# Patient Record
Sex: Female | Born: 2000
Health system: Southern US, Community
[De-identification: ages and names within clinical notes are randomized; demographics above are authoritative.]

## PROBLEM LIST (undated history)

## (undated) ENCOUNTER — Inpatient Hospital Stay (HOSPITAL_COMMUNITY): Admission: RE | Payer: BC Managed Care – PPO | Source: Ambulatory Visit

## (undated) DIAGNOSIS — R51 Headache: Secondary | ICD-10-CM

## (undated) DIAGNOSIS — D509 Iron deficiency anemia, unspecified: Secondary | ICD-10-CM

## (undated) DIAGNOSIS — F32A Depression, unspecified: Secondary | ICD-10-CM

## (undated) DIAGNOSIS — F909 Attention-deficit hyperactivity disorder, unspecified type: Secondary | ICD-10-CM

## (undated) DIAGNOSIS — F419 Anxiety disorder, unspecified: Secondary | ICD-10-CM

## (undated) DIAGNOSIS — G43909 Migraine, unspecified, not intractable, without status migrainosus: Secondary | ICD-10-CM

## (undated) DIAGNOSIS — F329 Major depressive disorder, single episode, unspecified: Secondary | ICD-10-CM

## (undated) DIAGNOSIS — N390 Urinary tract infection, site not specified: Secondary | ICD-10-CM

## (undated) HISTORY — DX: Anxiety disorder, unspecified: F41.9

## (undated) HISTORY — DX: Iron deficiency anemia, unspecified: D50.9

## (undated) HISTORY — DX: Major depressive disorder, single episode, unspecified: F32.9

## (undated) HISTORY — DX: Depression, unspecified: F32.A

## (undated) HISTORY — PX: NO PAST SURGERIES: SHX2092

## (undated) HISTORY — DX: Headache: R51

## (undated) HISTORY — DX: Urinary tract infection, site not specified: N39.0

## (undated) HISTORY — DX: Migraine, unspecified, not intractable, without status migrainosus: G43.909

## (undated) HISTORY — DX: Attention-deficit hyperactivity disorder, unspecified type: F90.9

---
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
## 2018-06-13 ENCOUNTER — Telehealth: Payer: Self-pay | Admitting: Primary Care

## 2018-06-13 NOTE — Telephone Encounter (Signed)
Please have her scheduled at her convenience.

## 2018-06-13 NOTE — Telephone Encounter (Signed)
Please advise:      Copied from CRM [id number]. Topic: Appointment Scheduling - Scheduling Inquiry for Clinic >> Jun 13, 2018 12:06 PM Vivia Ewing A wrote: Reason for CRM: Pt. Mother Cordelia Pen) calling to see if she can schedule CPE for daughter (last 10/30/16) and to discuss pt. Getting on birth control. Pt hoping to get appt ASAP.  Please return call to pt. Mother to advise when pt. Can be seen.  If abiding by session limits, pt cannot be seen until December (at the earliest)

## 2018-06-22 ENCOUNTER — Ambulatory Visit (INDEPENDENT_AMBULATORY_CARE_PROVIDER_SITE_OTHER): Payer: BLUE CROSS/BLUE SHIELD | Admitting: Primary Care

## 2018-06-22 ENCOUNTER — Encounter: Payer: Self-pay | Admitting: Primary Care

## 2018-06-22 VITALS — BP 116/76 | HR 96 | Temp 98.2°F | Ht 66.0 in | Wt 156.5 lb

## 2018-06-22 DIAGNOSIS — F419 Anxiety disorder, unspecified: Secondary | ICD-10-CM | POA: Diagnosis not present

## 2018-06-22 DIAGNOSIS — K580 Irritable bowel syndrome with diarrhea: Secondary | ICD-10-CM

## 2018-06-22 DIAGNOSIS — Z113 Encounter for screening for infections with a predominantly sexual mode of transmission: Secondary | ICD-10-CM

## 2018-06-22 DIAGNOSIS — Z30016 Encounter for initial prescription of transdermal patch hormonal contraceptive device: Secondary | ICD-10-CM

## 2018-06-22 DIAGNOSIS — R51 Headache: Secondary | ICD-10-CM

## 2018-06-22 DIAGNOSIS — F329 Major depressive disorder, single episode, unspecified: Secondary | ICD-10-CM

## 2018-06-22 DIAGNOSIS — Z0000 Encounter for general adult medical examination without abnormal findings: Secondary | ICD-10-CM | POA: Diagnosis not present

## 2018-06-22 DIAGNOSIS — R519 Headache, unspecified: Secondary | ICD-10-CM

## 2018-06-22 LAB — POCT URINE PREGNANCY: PREG TEST UR: NEGATIVE

## 2018-06-22 MED ORDER — NORELGESTROMIN-ETH ESTRADIOL 150-35 MCG/24HR TD PTWK: MEDICATED_PATCH | TRANSDERMAL | 3 refills | Status: DC

## 2018-06-22 NOTE — Progress Notes (Signed)
Subjective:    Patient ID: Chelsea Ellis, female    DOB: [birth year], 17 y.o.   MRN: [id number]  HPI  Chelsea Ellis is a 17 year old female who presents today for complete physical. She'd also like STD testing.  She would like to discuss birth control options. She was once managed on OCP's but couldn't remember to take her pill daily.  She's interested in the birth control patch. LMP was 10/29-11/02. She is sexually active.   Immunizations: -Tetanus: Completed in 2012 -Influenza: Declines -HPV: Completed three doses   Diet: She endorses a healthy diet Breakfast: Yogurt, cereal  Lunch: Skips Dinner: Meat, vegetables, starch, some take out food Snacks: Crackers, toast Desserts: Once weekly  Beverages: Soda, juice, some water, sometimes sweet tea  Exercise: She is not exercising, some walking Eye exam: Completed years ago Dental exam: Completes semi-annually    Review of Systems  Constitutional: Negative for unexpected weight change.  HENT: Negative for rhinorrhea.   Respiratory: Negative for cough and shortness of breath.   Cardiovascular: Negative for chest pain.  Gastrointestinal: Negative for constipation and diarrhea.  Genitourinary: Negative for difficulty urinating, dysuria, genital sores, menstrual problem and pelvic pain.  Musculoskeletal: Negative for arthralgias and myalgias.  Skin: Negative for rash.  Allergic/Immunologic: Negative for environmental allergies.  Neurological: Negative for dizziness, numbness and headaches.  Psychiatric/Behavioral: The patient is not nervous/anxious.        Past Medical History:  Diagnosis Date  . Depression   . Frequent UTI   . Headache(784.0)   . Iron deficiency anemia   . Migraine      Social History   Socioeconomic History  . Marital status: Single    Spouse name: Not on file  . Number of children: Not on file  . Years of education: Not on file  . Highest education level: Not on file  Occupational History  .  Not on file  Social Needs  . Financial resource strain: Not on file  . Food insecurity:    Worry: Not on file    Inability: Not on file  . Transportation needs:    Medical: Not on file    Non-medical: Not on file  Tobacco Use  . Smoking status: Never Smoker  . Smokeless tobacco: Never Used  Substance and Sexual Activity  . Alcohol use: No  . Drug use: No  . Sexual activity: Never  Lifestyle  . Physical activity:    Days per week: Not on file    Minutes per session: Not on file  . Stress: Not on file  Relationships  . Social connections:    Talks on phone: Not on file    Gets together: Not on file    Attends religious service: Not on file    Active member of club or organization: Not on file    Attends meetings of clubs or organizations: Not on file    Relationship status: Not on file  . Intimate partner violence:    Fear of current or ex partner: Not on file    Emotionally abused: Not on file    Physically abused: Not on file    Forced sexual activity: Not on file  Other Topics Concern  . Not on file  Social History Narrative   Gradulated [employer], working now    Enjoys Spanish and American History.   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    Enjoys listening to music, reading, spending time with friends.  Past Surgical History:  Procedure Laterality Date  . NO PAST SURGERIES      Family History  Problem Relation Age of Onset  . Migraines Mother   . Cancer Maternal Grandmother        Died at 1  . Migraines Maternal Grandmother   . Migraines Maternal Grandfather   . Migraines Maternal Aunt   . Migraines Maternal Uncle     Allergies  Allergen Reactions  . Sulfur Hives  . Sulfamethoxazole Hives    Current Outpatient Medications on File Prior to Visit  Medication Sig Dispense Refill  . hyoscyamine (LEVSIN SL) 0.125 MG SL tablet Place 1 tablet (0.125 mg total) under the tongue every 4 (four) hours as needed for cramping. 30 tablet 0  .  naproxen (NAPROSYN) 500 MG tablet Take 1 tablet (500 mg total) by mouth 2 (two) times daily with a meal. For h/a as needed 60 tablet 2  . ondansetron (ZOFRAN) 4 MG tablet Take 1 tablet (4 mg total) by mouth every 8 (eight) hours as needed for nausea or vomiting. (Patient not taking: Reported on 11/04/2017) 30 tablet 2  . rizatriptan (MAXALT) 10 MG tablet Take 1 tablet (10 mg total) by mouth as needed for migraine. May repeat in 2 hours if needed. Max 3 pills in 24 hours (Patient not taking: Reported on 11/04/2017) 10 tablet 0   No current facility-administered medications on file prior to visit.     BP 116/76   Pulse 96   Temp 98.2 F (36.8 C) (Oral)   Ht 5\' 6"  (1.676 m)   Wt 156 lb 8 oz (71 kg)   LMP 06/17/2018   SpO2 98%   BMI 25.26 kg/m    Objective:   Physical Exam  Constitutional: She is oriented to person, place, and time. She appears well-nourished.  HENT:  Mouth/Throat: No oropharyngeal exudate.  Eyes: Pupils are equal, round, and reactive to light. EOM are normal.  Neck: Neck supple. No thyromegaly present.  Cardiovascular: Normal rate and regular rhythm.  Respiratory: Effort normal and breath sounds normal.  GI: Soft. Bowel sounds are normal. There is no tenderness.  Musculoskeletal: Normal range of motion.  Neurological: She is alert and oriented to person, place, and time.  Skin: Skin is warm and dry.  Psychiatric: She has a normal mood and affect.           Assessment & Plan:

## 2018-06-22 NOTE — Assessment & Plan Note (Addendum)
Migraines once every 2-3 weeks. Using Maxalt once monthly, sometimes less frequently. Using Naproxen for headaches several times weekly. Infrequent use of Zofran. Continue to monitor.

## 2018-06-22 NOTE — Assessment & Plan Note (Signed)
Could not remember to take pills. Discussed options and she'd like to proceed with transdermal patch. Discussed instructions for use and potential side effects. Urine pregnancy negative.   Rx for OrthoEvra patches sent to pharmacy.

## 2018-06-22 NOTE — Assessment & Plan Note (Addendum)
Stopped taking Zoloft in May/June 2019, overall doing well. Continue to monitor.

## 2018-06-22 NOTE — Assessment & Plan Note (Signed)
Overall improved, using Levsin 1-2 times weekly on average. Continue same.

## 2018-06-22 NOTE — Patient Instructions (Addendum)
Start exercising. You should be getting 150 minutes of moderate intensity exercise weekly.  It's important to improve your diet by reducing consumption of fast food, fried food, processed snack foods, sugary drinks. Increase consumption of fresh vegetables and fruits, whole grains, water.  Ensure you are drinking 64 ounces of water daily.  Apply the birth control patch once weekly for three weeks. Leave the patch off of your skin for one week to allow for a menstrual cycle.  Remember to take off and re-apply the patch on the same day of the week.  You can apply the patch to the buttocks, abdomen, upper arm, upper torso except for breasts.   Schedule a lab only appointment to return at your convenience.  It was a pleasure to see you today!  Ethinyl Estradiol; Norelgestromin skin patches What is this medicine? ETHINYL ESTRADIOL;NORELGESTROMIN (ETH in il es tra DYE ole; nor el JES troe min) skin patch is used as a contraceptive (birth control method). This medicine combines two types of female hormones, an estrogen and a progestin. This patch is used to prevent ovulation and pregnancy. This medicine may be used for other purposes; ask your health care provider or pharmacist if you have questions. COMMON BRAND NAME(S): Ortho Christianne Borrow What should I tell my health care provider before I take this medicine? They need to know if you have or ever had any of these conditions: -abnormal vaginal bleeding -blood vessel disease or blood clots -breast, cervical, endometrial, ovarian, liver, or uterine cancer -diabetes -gallbladder disease -heart disease or recent heart attack -high blood pressure -high cholesterol -kidney disease -liver disease -migraine headaches -stroke -systemic lupus erythematosus (SLE) -tobacco smoker -an unusual or allergic reaction to estrogens, progestins, other medicines, foods, dyes, or preservatives -pregnant or trying to get pregnant -breast-feeding How should  I use this medicine? This patch is applied to the skin. Follow the directions on the prescription label. Apply to clean, dry, healthy skin on the buttock, abdomen, upper outer arm or upper torso, in a place where it will not be rubbed by tight clothing. Do not use lotions or other cosmetics on the site where the patch will go. Press the patch firmly in place for 10 seconds to ensure good contact with the skin. Change the patch every 7 days on the same day of the week for 3 weeks. You will then have a break from the patch for 1 week, after which you will apply a new patch. Do not use your medicine more often than directed. Contact your pediatrician regarding the use of this medicine in children. Special care may be needed. This medicine has been used in female children who have started having menstrual periods. A patient package insert for the product will be given with each prescription and refill. Read this sheet carefully each time. The sheet may change frequently. Overdosage: If you think you have taken too much of this medicine contact a poison control center or emergency room at once. NOTE: This medicine is only for you. Do not share this medicine with others. What if I miss a dose? You will need to replace your patch once a week as directed. If your patch is lost or falls off, contact your health care professional for advice. You may need to use another form of birth control if your patch has been off for more than 1 day. What may interact with this medicine? Do not take this medicine with the following medication: -dasabuvir; ombitasvir; paritaprevir; ritonavir -ombitasvir; paritaprevir; ritonavir  This medicine may also interact with the following medications: -acetaminophen -antibiotics or medicines for infections, especially rifampin, rifabutin, rifapentine, and griseofulvin, and possibly penicillins or tetracyclines -aprepitant -ascorbic acid (vitamin C) -atorvastatin -barbiturate  medicines, such as phenobarbital -bosentan -carbamazepine -caffeine -clofibrate -cyclosporine -dantrolene -doxercalciferol -felbamate -grapefruit juice -hydrocortisone -medicines for anxiety or sleeping problems, such as diazepam or temazepam -medicines for diabetes, including pioglitazone -modafinil -mycophenolate -nefazodone -oxcarbazepine -phenytoin -prednisolone -ritonavir or other medicines for HIV infection or AIDS -rosuvastatin -selegiline -soy isoflavones supplements -St. John's wort -tamoxifen or raloxifene -theophylline -thyroid hormones -topiramate -warfarin This list may not describe all possible interactions. Give your health care provider a list of all the medicines, herbs, non-prescription drugs, or dietary supplements you use. Also tell them if you smoke, drink alcohol, or use illegal drugs. Some items may interact with your medicine. What should I watch for while using this medicine? Visit your doctor or health care professional for regular checks on your progress. You will need a regular breast and pelvic exam and Pap smear while on this medicine. Use an additional method of contraception during the first cycle that you use this patch. If you have any reason to think you are pregnant, stop using this medicine right away and contact your doctor or health care professional. If you are using this medicine for hormone related problems, it may take several cycles of use to see improvement in your condition. Smoking increases the risk of getting a blood clot or having a stroke while you are using hormonal birth control, especially if you are more than 19 years old. You are strongly advised not to smoke. This medicine can make your body retain fluid, making your fingers, hands, or ankles swell. Your blood pressure can go up. Contact your doctor or health care professional if you feel you are retaining fluid. This medicine can make you more sensitive to the sun. Keep  out of the sun. If you cannot avoid being in the sun, wear protective clothing and use sunscreen. Do not use sun lamps or tanning beds/booths. If you wear contact lenses and notice visual changes, or if the lenses begin to feel uncomfortable, consult your eye care specialist. In some women, tenderness, swelling, or minor bleeding of the gums may occur. Notify your dentist if this happens. Brushing and flossing your teeth regularly may help limit this. See your dentist regularly and inform your dentist of the medicines you are taking. If you are going to have elective surgery or a MRI, you may need to stop using this medicine before the surgery or MRI. Consult your health care professional for advice. This medicine does not protect you against HIV infection (AIDS) or any other sexually transmitted diseases. What side effects may I notice from receiving this medicine? Side effects that you should report to your doctor or health care professional as soon as possible: -breast tissue changes or discharge -changes in vaginal bleeding during your period or between your periods -chest pain -coughing up blood -dizziness or fainting spells -headaches or migraines -leg, arm or groin pain -severe or sudden headaches -stomach pain (severe) -sudden shortness of breath -sudden loss of coordination, especially on one side of the body -speech problems -symptoms of vaginal infection like itching, irritation or unusual discharge -tenderness in the upper abdomen -vomiting -weakness or numbness in the arms or legs, especially on one side of the body -yellowing of the eyes or skin Side effects that usually do not require medical attention (report to your doctor or health care  professional if they continue or are bothersome): -breakthrough bleeding and spotting that continues beyond the 3 initial cycles of pills -breast tenderness -mood changes, anxiety, depression, frustration, anger, or emotional  outbursts -increased sensitivity to sun or ultraviolet light -nausea -skin rash, acne, or brown spots on the skin -weight gain (slight) This list may not describe all possible side effects. Call your doctor for medical advice about side effects. You may report side effects to FDA at 1-800-FDA-1088. Where should I keep my medicine? Keep out of the reach of children. Store at room temperature between 15 and 30 degrees C (59 and 86 degrees F). Keep the patch in its pouch until time of use. Throw away any unused medicine after the expiration date. Dispose of used patches properly. Since a used patch may still contain active hormones, fold the patch in half so that it sticks to itself prior to disposal. Throw away in a place where children or pets cannot reach. NOTE: This sheet is a summary. It may not cover all possible information. If you have questions about this medicine, talk to your doctor, pharmacist, or health care provider.  2018 Elsevier/Gold Standard (2016-04-13 07:59:03)

## 2018-06-22 NOTE — Assessment & Plan Note (Signed)
Immunizations UTD. Declines influenza vaccination. Pap smear due at age 17. Recommended regular exercise, healthy diet. Exam unremarkable. Labs pending. She will return for blood draw. Follow up in 1 year for CPE.

## 2018-06-23 LAB — C. TRACHOMATIS/N. GONORRHOEAE RNA
C. TRACHOMATIS RNA, TMA: NOT DETECTED
N. gonorrhoeae RNA, TMA: NOT DETECTED

## 2018-06-23 LAB — TRICHOMONAS VAGINALIS, PROBE AMP: TRICHOMONAS VAGINALIS RNA: NOT DETECTED

## 2018-06-27 ENCOUNTER — Other Ambulatory Visit: Payer: BLUE CROSS/BLUE SHIELD

## 2018-08-18 ENCOUNTER — Telehealth: Payer: Self-pay

## 2018-08-18 NOTE — Telephone Encounter (Signed)
Pt has had a missed period; home pregnancy tests have been neg. LMP was 07/06/2018. Pt has missed periods before but this is first time pt has tracked how many days missed. Pt is sexually active; not using any form of birth control. Pt was advised to schedule appt to see Allayne Gitelman NP and get blood test for pregnancy but pt declined and wants to talk with Mayra Reel NP first.

## 2018-08-18 NOTE — Telephone Encounter (Signed)
Spoken to patient and she stated that she did not start on the patch as of yet. She was going to do it this month but was afraid since she did not a period since 07/06/2018. She has been doing home pregnancy test for weeks now and all were negative. Please advise. Patient wanted to know what else can she without coming in.

## 2018-08-18 NOTE — Telephone Encounter (Signed)
If she doesn't wish to come in for pregnancy testing that's okay, just have her monitor for her cycles. She is on the birth control patch so sometimes periods can be irregular, especially in the beginning. This is the case when starting any new birth control. It may take 3-6 months for her cycles to regulate. Have her come see me if she continues to miss cycles and/or if she has a positive urine pregnancy test.

## 2018-08-19 NOTE — Telephone Encounter (Signed)
Nothing else can be done at this point if she doesn't wish to come in, she'll just need to wait and monitor her cycle for January.

## 2018-08-19 NOTE — Telephone Encounter (Signed)
Could not leave message since voicemail is full

## 2018-08-23 NOTE — Telephone Encounter (Signed)
Spoken to patient and she stated that her period have started.

## 2018-09-09 ENCOUNTER — Ambulatory Visit: Payer: BLUE CROSS/BLUE SHIELD | Admitting: Family Medicine

## 2018-09-15 ENCOUNTER — Ambulatory Visit (INDEPENDENT_AMBULATORY_CARE_PROVIDER_SITE_OTHER): Payer: BLUE CROSS/BLUE SHIELD | Admitting: Family Medicine

## 2018-09-15 ENCOUNTER — Encounter: Payer: Self-pay | Admitting: Family Medicine

## 2018-09-15 ENCOUNTER — Telehealth: Payer: Self-pay | Admitting: Family Medicine

## 2018-09-15 VITALS — BP 110/70 | Temp 98.6°F | Ht 66.01 in | Wt 153.0 lb

## 2018-09-15 DIAGNOSIS — L0390 Cellulitis, unspecified: Secondary | ICD-10-CM

## 2018-09-15 DIAGNOSIS — B958 Unspecified staphylococcus as the cause of diseases classified elsewhere: Secondary | ICD-10-CM | POA: Diagnosis not present

## 2018-09-15 MED ORDER — DOXYCYCLINE HYCLATE 100 MG PO TABS: 100.0000 mg | ORAL_TABLET | Freq: Two times a day (BID) | ORAL | 0 refills | Status: DC

## 2018-09-15 MED ORDER — KETOROLAC TROMETHAMINE 10 MG PO TABS: 10.0000 mg | ORAL_TABLET | Freq: Four times a day (QID) | ORAL | 0 refills | Status: DC | PRN

## 2018-09-15 NOTE — Telephone Encounter (Signed)
Patient refused injection. She can still have pills.

## 2018-09-15 NOTE — Progress Notes (Signed)
Established Patient Office Visit  Subjective:  Patient ID: Chelsea Ellis, female    DOB: [birth year]  Age: 18 y.o. MRN: [id number]  CC:  Chief Complaint  Patient presents with  . bug bite on elbow    HPI Chelsea ChaletCourtney T Epps presents for evaluation and treatment of a tender sore on the back of her left elbow that is currently draining pus.  Affected area developed swelling and erythema about a week ago progressed until it spontaneously started to drain a few days ago.  Patient is concerned about apparent bug bite.  No bug was seen.  No painful sting was appreciated.  There is been no fever or chills.  Patient has been applying antibiotic cream.  Past Medical History:  Diagnosis Date  . Depression   . Frequent UTI   . Headache(784.0)   . Iron deficiency anemia   . Migraine     Past Surgical History:  Procedure Laterality Date  . NO PAST SURGERIES      Family History  Problem Relation Age of Onset  . Migraines Mother   . Cancer Maternal Grandmother        Died at 2364  . Migraines Maternal Grandmother   . Migraines Maternal Grandfather   . Migraines Maternal Aunt   . Migraines Maternal Uncle     Social History   Socioeconomic History  . Marital status: Single    Spouse name: Not on file  . Number of children: Not on file  . Years of education: Not on file  . Highest education level: Not on file  Occupational History  . Not on file  Social Needs  . Financial resource strain: Not on file  . Food insecurity:    Worry: Not on file    Inability: Not on file  . Transportation needs:    Medical: Not on file    Non-medical: Not on file  Tobacco Use  . Smoking status: Never Smoker  . Smokeless tobacco: Never Used  Substance and Sexual Activity  . Alcohol use: No  . Drug use: No  . Sexual activity: Never  Lifestyle  . Physical activity:    Days per week: Not on file    Minutes per session: Not on file  . Stress: Not on file  Relationships  . Social  connections:    Talks on phone: Not on file    Gets together: Not on file    Attends religious service: Not on file    Active member of club or organization: Not on file    Attends meetings of clubs or organizations: Not on file    Relationship status: Not on file  . Intimate partner violence:    Fear of current or ex partner: Not on file    Emotionally abused: Not on file    Physically abused: Not on file    Forced sexual activity: Not on file  Other Topics Concern  . Not on file  Social History Narrative   Gradulated [employer], working now    Enjoys Spanish and American History.   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    Enjoys listening to music, reading, spending time with friends.    Outpatient Medications Prior to Visit  Medication Sig Dispense Refill  . hyoscyamine (LEVSIN SL) 0.125 MG SL tablet Place 1 tablet (0.125 mg total) under the tongue every 4 (four) hours as needed for cramping. 30 tablet 0  . naproxen (NAPROSYN) 500 MG tablet Take 1 tablet (  500 mg total) by mouth 2 (two) times daily with a meal. For h/a as needed 60 tablet 2  . norelgestromin-ethinyl estradiol (ORTHO EVRA) 150-35 MCG/24HR transdermal patch Apply one patch to the skin once weekly for three weeks, then remove for one week. 9 patch 3  . rizatriptan (MAXALT) 10 MG tablet Take 1 tablet (10 mg total) by mouth as needed for migraine. May repeat in 2 hours if needed. Max 3 pills in 24 hours 10 tablet 0  . ondansetron (ZOFRAN) 4 MG tablet Take 1 tablet (4 mg total) by mouth every 8 (eight) hours as needed for nausea or vomiting. (Patient not taking: Reported on 11/04/2017) 30 tablet 2   No facility-administered medications prior to visit.     Allergies  Allergen Reactions  . Sulfur Hives  . Sulfamethoxazole Hives    ROS Review of Systems  Constitutional: Negative for chills, diaphoresis, fatigue, fever and unexpected weight change.  Respiratory: Negative.   Cardiovascular: Negative.     Gastrointestinal: Negative.   Musculoskeletal: Negative for arthralgias.  Skin: Positive for color change, rash and wound.  Neurological: Negative for weakness and numbness.  Hematological: Does not bruise/bleed easily.      Objective:    Physical Exam  Constitutional: She is oriented to person, place, and time. She appears well-developed and well-nourished.  HENT:  Head: Normocephalic and atraumatic.  Right Ear: External ear normal.  Left Ear: External ear normal.  Eyes: Conjunctivae are normal. Right eye exhibits no discharge. Left eye exhibits no discharge. No scleral icterus.  Neck: No JVD present. No tracheal deviation present.  Cardiovascular:  Pulses:      Radial pulses are 2+ on the left side.  Pulmonary/Chest: Effort normal. No stridor.  Musculoskeletal:     Left elbow: She exhibits swelling and laceration. She exhibits normal range of motion, no effusion and no deformity. Tenderness found.  Lymphadenopathy:       Left axillary: No pectoral and no lateral adenopathy present. Neurological: She is alert and oriented to person, place, and time.  Skin: She is not diaphoretic.     Psychiatric: She has a normal mood and affect. Her behavior is normal.    BP 110/70   Temp 98.6 F (37 C) (Oral)   Ht 5' 6.01" (1.677 m)   Wt 153 lb (69.4 kg)   BMI 24.69 kg/m  Wt Readings from Last 3 Encounters:  09/15/18 153 lb (69.4 kg) (84 %, Z= 0.99)*  06/22/18 156 lb 8 oz (71 kg) (87 %, Z= 1.11)*  11/04/17 168 lb 8 oz (76.4 kg) (93 %, Z= 1.45)*   * Growth percentiles are based on CDC (Girls, 2-20 Years) data.   BP Readings from Last 3 Encounters:  09/15/18 110/70  06/22/18 116/76  11/04/17 116/76   Guideline developer:  UpToDate (see UpToDate for funding source) Date Released: June 2014  Health Maintenance Due  Topic Date Due  . HIV Screening  08/06/2014  . INFLUENZA VACCINE  03/17/2018    There are no preventive care reminders to display for this patient.  Lab  Results  Component Value Date   TSH 1.75 12/19/2015   Lab Results  Component Value Date   WBC 6.7 12/19/2015   HGB 13.6 12/19/2015   HCT 40.3 12/19/2015   MCV 91.9 12/19/2015   PLT 232.0 12/19/2015   Lab Results  Component Value Date   NA 138 12/19/2015   K 3.4 (L) 12/19/2015   CO2 22 12/19/2015   GLUCOSE 107 (H) 12/19/2015  BUN 4 (L) 12/19/2015   CREATININE 0.77 12/19/2015   BILITOT 0.6 12/19/2015   ALKPHOS 53 12/19/2015   AST 14 12/19/2015   ALT 9 12/19/2015   PROT 7.1 12/19/2015   ALBUMIN 4.4 12/19/2015   CALCIUM 9.6 12/19/2015   GFR 105.80 12/19/2015   No results found for: CHOL No results found for: HDL No results found for: LDLCALC No results found for: TRIG No results found for: CHOLHDL Lab Results  Component Value Date   HGBA1C 5.3 12/19/2015      Assessment & Plan:   Problem List Items Addressed This Visit      Other   Cellulitis due to Staphylococcus - Primary   Relevant Medications   doxycycline (VIBRA-TABS) 100 MG tablet   ketorolac (TORADOL) 10 MG tablet   Other Relevant Orders   Wound culture   Wound culture      Meds ordered this encounter  Medications  . doxycycline (VIBRA-TABS) 100 MG tablet    Sig: Take 1 tablet (100 mg total) by mouth 2 (two) times daily. With food.    Dispense:  20 tablet    Refill:  0  . ketorolac (TORADOL) 10 MG tablet    Sig: Take 1 tablet (10 mg total) by mouth every 6 (six) hours as needed.    Dispense:  20 tablet    Refill:  0    Follow-up: Return in about 4 days (around 09/19/2018).

## 2018-09-15 NOTE — Telephone Encounter (Signed)
  Copied from CRM [id number]. Topic: Quick Communication - Rx Refill/Question >> Sep 15, 2018  3:08 PM Angela Nevin wrote: Medication: ketorolac (TORADOL) 10 MG tablet   [facility] with [facility], requesting call back from nurse to clarify prescription. Specifically wanting to know if manufacturer guidelines were followed when prescribing this medication. Per manufacturer, Shawna Orleans states that before oral medication can be prescribed- patient should have received injection of this medication during OV. Please advise. [id number] [place], [place] - [address] [phone]  Angela Nevin   Will route to office for final disposition; pt last seen in office 09/15/2018 by Dr Doreene Burke at [facility]; also notified [place].

## 2018-09-15 NOTE — Telephone Encounter (Signed)
I called and spoke with pharmacy; they are aware of message below & will fill Rx.

## 2018-09-15 NOTE — Patient Instructions (Signed)

## 2018-09-15 NOTE — Telephone Encounter (Signed)
Copied from CRM [id number]. Topic: Quick Communication - Rx Refill/Question >> Sep 15, 2018  3:08 PM Angela Nevin wrote: Medication: ketorolac (TORADOL) 10 MG tablet   [facility] with [facility], requesting call back from nurse to clarify prescription. Specifically wanting to know if manufacturer guidelines were followed when prescribing this medication. Per manufacturer, Shawna Orleans states that before oral medication can be prescribed- patient should have received injection of this medication during OV. Please advise.

## 2018-09-18 LAB — WOUND CULTURE
MICRO NUMBER:: 128052
SPECIMEN QUALITY:: ADEQUATE

## 2018-09-22 ENCOUNTER — Ambulatory Visit (INDEPENDENT_AMBULATORY_CARE_PROVIDER_SITE_OTHER): Payer: BLUE CROSS/BLUE SHIELD | Admitting: Primary Care

## 2018-09-22 ENCOUNTER — Encounter: Payer: Self-pay | Admitting: Primary Care

## 2018-09-22 VITALS — BP 122/80 | HR 68 | Temp 98.2°F | Ht 66.0 in | Wt 156.8 lb

## 2018-09-22 DIAGNOSIS — B958 Unspecified staphylococcus as the cause of diseases classified elsewhere: Secondary | ICD-10-CM | POA: Diagnosis not present

## 2018-09-22 DIAGNOSIS — L0390 Cellulitis, unspecified: Secondary | ICD-10-CM

## 2018-09-22 DIAGNOSIS — L301 Dyshidrosis [pompholyx]: Secondary | ICD-10-CM | POA: Diagnosis not present

## 2018-09-22 MED ORDER — TRIAMCINOLONE ACETONIDE 0.5 % EX OINT: 1.0000 "application " | TOPICAL_OINTMENT | Freq: Two times a day (BID) | CUTANEOUS | 0 refills | Status: DC

## 2018-09-22 NOTE — Progress Notes (Signed)
Subjective:    Patient ID: Chelsea Ellis, female    DOB: 2001/01/20, 18 y.o.   MRN: [id number]  HPI  Chelsea Ellis is a 18 year old female who presents today for follow up of cellulitis and a chief complaint of rash.  She was evaluated on 09/15/18 by Dr. Doreene Burke for a chief complaint of insect bite to the elbow. She was diagnosed with cellulitis secondary to staphylococcus and was treated with Doxycycline 100 mg BID x 10 days and Toradol 10 mg PRN.  Since her last visit she's noticed significant improvement in swelling, redness, and pain. Overall feeling better. She is compliant to her Doxycycline and has four days remaining. She's used the Toradol twice total.   She also endorses rash to bilateral hands intermittently for the last 6 months. She's been using a sample Rx cream that was provided to her from a friend's family member who is a Armed forces operational officer. She's notices some improvement but no resolve. Has not been using daily as this is a small sample and the Rx is too expensive.   Review of Systems  Constitutional: Negative for fever.  Musculoskeletal: Negative for arthralgias.  Skin: Positive for rash and wound.       Past Medical History:  Diagnosis Date  . Depression   . Frequent UTI   . Headache(784.0)   . Iron deficiency anemia   . Migraine      Social History   Socioeconomic History  . Marital status: Single    Spouse name: Not on file  . Number of children: Not on file  . Years of education: Not on file  . Highest education level: Not on file  Occupational History  . Not on file  Social Needs  . Financial resource strain: Not on file  . Food insecurity:    Worry: Not on file    Inability: Not on file  . Transportation needs:    Medical: Not on file    Non-medical: Not on file  Tobacco Use  . Smoking status: Never Smoker  . Smokeless tobacco: Never Used  Substance and Sexual Activity  . Alcohol use: No  . Drug use: No  . Sexual activity: Never  Lifestyle   . Physical activity:    Days per week: Not on file    Minutes per session: Not on file  . Stress: Not on file  Relationships  . Social connections:    Talks on phone: Not on file    Gets together: Not on file    Attends religious service: Not on file    Active member of club or organization: Not on file    Attends meetings of clubs or organizations: Not on file    Relationship status: Not on file  . Intimate partner violence:    Fear of current or ex partner: Not on file    Emotionally abused: Not on file    Physically abused: Not on file    Forced sexual activity: Not on file  Other Topics Concern  . Not on file  Social History Narrative   Gradulated [employer], working now    Enjoys Spanish and American History.   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    Enjoys listening to music, reading, spending time with friends.    Past Surgical History:  Procedure Laterality Date  . NO PAST SURGERIES      Family History  Problem Relation Age of Onset  . Migraines Mother   . Cancer  Maternal Grandmother        Died at 14  . Migraines Maternal Grandmother   . Migraines Maternal Grandfather   . Migraines Maternal Aunt   . Migraines Maternal Uncle     Allergies  Allergen Reactions  . Sulfur Hives  . Sulfamethoxazole Hives    Current Outpatient Medications on File Prior to Visit  Medication Sig Dispense Refill  . doxycycline (VIBRA-TABS) 100 MG tablet Take 1 tablet (100 mg total) by mouth 2 (two) times daily. With food. 20 tablet 0  . hyoscyamine (LEVSIN SL) 0.125 MG SL tablet Place 1 tablet (0.125 mg total) under the tongue every 4 (four) hours as needed for cramping. 30 tablet 0  . ketorolac (TORADOL) 10 MG tablet Take 1 tablet (10 mg total) by mouth every 6 (six) hours as needed. 20 tablet 0  . naproxen (NAPROSYN) 500 MG tablet Take 1 tablet (500 mg total) by mouth 2 (two) times daily with a meal. For h/a as needed 60 tablet 2  . norelgestromin-ethinyl  estradiol (ORTHO EVRA) 150-35 MCG/24HR transdermal patch Apply one patch to the skin once weekly for three weeks, then remove for one week. 9 patch 3  . rizatriptan (MAXALT) 10 MG tablet Take 1 tablet (10 mg total) by mouth as needed for migraine. May repeat in 2 hours if needed. Max 3 pills in 24 hours 10 tablet 0   No current facility-administered medications on file prior to visit.     BP 122/80   Pulse 68   Temp 98.2 F (36.8 C) (Oral)   Ht 5\' 6"  (1.676 m)   Wt 156 lb 12 oz (71.1 kg)   LMP 09/06/2018   SpO2 95%   BMI 25.30 kg/m    Objective:   Physical Exam  Cardiovascular: Normal rate.  Respiratory: Effort normal.  Musculoskeletal:       Hands:  Skin: Skin is warm and dry. Rash noted. There is erythema.  Dry, peeling skin to bilateral palmer hands           Assessment & Plan:

## 2018-09-22 NOTE — Assessment & Plan Note (Signed)
Noted to hands bilaterally. Treat with triamcinolone BID x 2-3 weeks. She will update.

## 2018-09-22 NOTE — Patient Instructions (Signed)
Continue taking Doxycycline antibiotics twice daily.  You can use the Ketorolac medication as needed for pain and inflammation.  Apply the triamcinolone ointment twice daily for 2-3 weeks until resolve. Use as needed thereafter.  Please call me if you develop increased redness, swelling, drainage, fevers.  It was a pleasure to see you today!

## 2018-09-22 NOTE — Assessment & Plan Note (Signed)
Diagnosed on 09/15/18, treated with doxycycline and Toradol. Today wound appears to be improved and healing well. Continue Doxycycline until complete. Return precautions provided.

## 2018-12-09 ENCOUNTER — Ambulatory Visit (INDEPENDENT_AMBULATORY_CARE_PROVIDER_SITE_OTHER): Payer: BLUE CROSS/BLUE SHIELD | Admitting: Primary Care

## 2018-12-09 ENCOUNTER — Other Ambulatory Visit: Payer: Self-pay

## 2018-12-09 DIAGNOSIS — H5789 Other specified disorders of eye and adnexa: Secondary | ICD-10-CM | POA: Insufficient documentation

## 2018-12-09 DIAGNOSIS — H00011 Hordeolum externum right upper eyelid: Secondary | ICD-10-CM | POA: Insufficient documentation

## 2018-12-09 MED ORDER — ERYTHROMYCIN 5 MG/GM OP OINT: 1.0000 "application " | TOPICAL_OINTMENT | Freq: Two times a day (BID) | OPHTHALMIC | 0 refills | Status: DC

## 2018-12-09 NOTE — Assessment & Plan Note (Signed)
Obvious stye to right eye, but moderate erythema and swelling to entire upper lid is concerning and appears suspicious.  Given that her symptoms began shortly after contact with her dogs paw coupled with her presentation today, we will treat for potential infection. Rx for erythromycin ointment sent to pharmacy, instructions provided for use. She will update in 3-4 days.

## 2018-12-09 NOTE — Progress Notes (Signed)
Subjective:    Patient ID: Chelsea Ellis, female    DOB: April 10, 2001, 18 y.o.   MRN: [id number]  HPI  Virtual Visit via Video Note  I connected with Chelsea Ellis on 12/09/18 at  3:40 PM EDT by a video enabled telemedicine application and verified that I am speaking with the correct person using two identifiers.   I discussed the limitations of evaluation and management by telemedicine and the availability of in person appointments. The patient expressed understanding and agreed to proceed. She is at home, I am in the office.  History of Present Illness:  [name] is a 18 year old female who presents today with a chief complaint of eye swelling.  She also reports burning, waking up with her right eye matted shut, yellow drainage with crusting. Her swelling began 2-3 days ago after her dog accidentally scratched her right eye with his paw, her swelling is located to the upper lid. She's noticed a style to the inner upper lid of her right eye. She's been using "Stye" relief OTC with temporary relief in burning. She denies redness or irritation to the sclera/conjunctiva, she can't open her eye all of the way due to swelling. She denies changes in vision.    Observations/Objective:  Right upper eye lid swelling with moderate erythema. Stye noted to right upper lid, medially. Sclera/conjunctiva appear clear, no irritation.  No drainage at this time.  Assessment and Plan:  Obvious stye to right eye, but moderate erythema and swelling to entire upper lid is concerning and appears suspicious.  Given that her symptoms began shortly after contact with her dogs paw coupled with her presentation today, we will treat for potential infection. Rx for erythromycin ointment sent to pharmacy, instructions provided for use. She will update in 3-4 days.  Follow Up Instructions:  Apply the erythromycin eye ointment twice daily to the upper eye as discussed. Do this for about one week.   Please update me in 3-4 days if no improvement.  It was a pleasure to see you today! Mayra Reel, NP-C   I discussed the assessment and treatment plan with the patient. The patient was provided an opportunity to ask questions and all were answered. The patient agreed with the plan and demonstrated an understanding of the instructions.   The patient was advised to call back or seek an in-person evaluation if the symptoms worsen or if the condition fails to improve as anticipated.     Doreene Nest, NP    Review of Systems  Constitutional: Negative for fever.  Eyes: Positive for pain, discharge and redness.       See HPI  Respiratory: Negative for cough.        Past Medical History:  Diagnosis Date  . Depression   . Frequent UTI   . Headache(784.0)   . Iron deficiency anemia   . Migraine      Social History   Socioeconomic History  . Marital status: Single    Spouse name: Not on file  . Number of children: Not on file  . Years of education: Not on file  . Highest education level: Not on file  Occupational History  . Not on file  Social Needs  . Financial resource strain: Not on file  . Food insecurity:    Worry: Not on file    Inability: Not on file  . Transportation needs:    Medical: Not on file    Non-medical: Not on file  Tobacco Use  . Smoking status: Never Smoker  . Smokeless tobacco: Never Used  Substance and Sexual Activity  . Alcohol use: No  . Drug use: No  . Sexual activity: Never  Lifestyle  . Physical activity:    Days per week: Not on file    Minutes per session: Not on file  . Stress: Not on file  Relationships  . Social connections:    Talks on phone: Not on file    Gets together: Not on file    Attends religious service: Not on file    Active member of club or organization: Not on file    Attends meetings of clubs or organizations: Not on file    Relationship status: Not on file  . Intimate partner violence:    Fear of current  or ex partner: Not on file    Emotionally abused: Not on file    Physically abused: Not on file    Forced sexual activity: Not on file  Other Topics Concern  . Not on file  Social History Narrative   Gradulated [employer], working now    Enjoys Spanish and American History.   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    Enjoys listening to music, reading, spending time with friends.    Past Surgical History:  Procedure Laterality Date  . NO PAST SURGERIES      Family History  Problem Relation Age of Onset  . Migraines Mother   . Cancer Maternal Grandmother        Died at 22  . Migraines Maternal Grandmother   . Migraines Maternal Grandfather   . Migraines Maternal Aunt   . Migraines Maternal Uncle     Allergies  Allergen Reactions  . Sulfur Hives  . Sulfamethoxazole Hives    Current Outpatient Medications on File Prior to Visit  Medication Sig Dispense Refill  . doxycycline (VIBRA-TABS) 100 MG tablet Take 1 tablet (100 mg total) by mouth 2 (two) times daily. With food. 20 tablet 0  . hyoscyamine (LEVSIN SL) 0.125 MG SL tablet Place 1 tablet (0.125 mg total) under the tongue every 4 (four) hours as needed for cramping. 30 tablet 0  . ketorolac (TORADOL) 10 MG tablet Take 1 tablet (10 mg total) by mouth every 6 (six) hours as needed. 20 tablet 0  . naproxen (NAPROSYN) 500 MG tablet Take 1 tablet (500 mg total) by mouth 2 (two) times daily with a meal. For h/a as needed 60 tablet 2  . norelgestromin-ethinyl estradiol (ORTHO EVRA) 150-35 MCG/24HR transdermal patch Apply one patch to the skin once weekly for three weeks, then remove for one week. 9 patch 3  . rizatriptan (MAXALT) 10 MG tablet Take 1 tablet (10 mg total) by mouth as needed for migraine. May repeat in 2 hours if needed. Max 3 pills in 24 hours 10 tablet 0  . triamcinolone ointment (KENALOG) 0.5 % Apply 1 application topically 2 (two) times daily. 30 g 0   No current facility-administered medications on  file prior to visit.     There were no vitals taken for this visit.   Objective:   Physical Exam  Constitutional: She is oriented to person, place, and time. She appears well-nourished.  HENT:  Right upper eye lid swelling with moderate erythema. Stye noted to right upper lid, medially. Sclera/conjunctiva appear clear, no irritation.  No drainage at this time.   Eyes: Conjunctivae are normal.  Neck: Neck supple.  Respiratory: Effort normal.  Neurological:  She is alert and oriented to person, place, and time.           Assessment & Plan:

## 2018-12-09 NOTE — Patient Instructions (Signed)
Apply the erythromycin eye ointment twice daily to the upper eye as discussed. Do this for about one week.  Please update me in 3-4 days if no improvement.  It was a pleasure to see you today! Mayra Reel, NP-C

## 2018-12-09 NOTE — Assessment & Plan Note (Signed)
Obvious stye to right eye, but moderate erythema and swelling to entire upper lid is concerning and appears suspicious.  Given that her symptoms began shortly after contact with her dogs paw coupled with her presentation today, we will treat for potential infection. Rx for erythromycin ointment sent to pharmacy, instructions provided for use. She will update in 3-4 days. 

## 2019-01-20 ENCOUNTER — Other Ambulatory Visit: Payer: Self-pay | Admitting: Primary Care

## 2019-01-20 DIAGNOSIS — L301 Dyshidrosis [pompholyx]: Secondary | ICD-10-CM

## 2019-01-21 NOTE — Telephone Encounter (Signed)
Last prescribed on 09/22/2018. Last appointment on 12/09/2018. No future appointment

## 2019-01-23 NOTE — Telephone Encounter (Signed)
How are her hands since using the triamcinolone ointment? Is this helping? How often does she use?

## 2019-01-24 ENCOUNTER — Telehealth: Payer: Self-pay | Admitting: *Deleted

## 2019-01-24 DIAGNOSIS — Z30016 Encounter for initial prescription of transdermal patch hormonal contraceptive device: Secondary | ICD-10-CM

## 2019-01-24 MED ORDER — NORELGESTROMIN-ETH ESTRADIOL 150-35 MCG/24HR TD PTWK: MEDICATED_PATCH | TRANSDERMAL | 1 refills | Status: DC

## 2019-01-24 NOTE — Telephone Encounter (Signed)
Per patient's mother, we resent the patches for [facility]

## 2019-01-24 NOTE — Telephone Encounter (Signed)
Patient left a voicemail stating that her pharmacy told her to call because her birth control pills have not been refilled yet.

## 2019-02-01 NOTE — Telephone Encounter (Signed)
Message left for patient to return my call.  

## 2019-02-03 NOTE — Telephone Encounter (Signed)
Message left for patient to return my call.  

## 2019-04-13 ENCOUNTER — Ambulatory Visit (INDEPENDENT_AMBULATORY_CARE_PROVIDER_SITE_OTHER)
Admission: RE | Admit: 2019-04-13 | Discharge: 2019-04-13 | Disposition: A | Discharge status: Home / Self Care | Payer: BC Managed Care – PPO | Source: Ambulatory Visit | Attending: Internal Medicine | Admitting: Internal Medicine

## 2019-04-13 ENCOUNTER — Encounter: Payer: Self-pay | Admitting: Internal Medicine

## 2019-04-13 ENCOUNTER — Ambulatory Visit (INDEPENDENT_AMBULATORY_CARE_PROVIDER_SITE_OTHER): Payer: BC Managed Care – PPO | Admitting: Internal Medicine

## 2019-04-13 ENCOUNTER — Other Ambulatory Visit: Payer: Self-pay

## 2019-04-13 DIAGNOSIS — M25571 Pain in right ankle and joints of right foot: Secondary | ICD-10-CM | POA: Diagnosis not present

## 2019-04-13 DIAGNOSIS — M79605 Pain in left leg: Secondary | ICD-10-CM

## 2019-04-13 DIAGNOSIS — S8992XA Unspecified injury of left lower leg, initial encounter: Secondary | ICD-10-CM | POA: Diagnosis not present

## 2019-04-13 DIAGNOSIS — S99911A Unspecified injury of right ankle, initial encounter: Secondary | ICD-10-CM | POA: Diagnosis not present

## 2019-04-13 DIAGNOSIS — T300 Burn of unspecified body region, unspecified degree: Secondary | ICD-10-CM

## 2019-04-13 MED ORDER — SILVER SULFADIAZINE 1 % EX CREA: 1.0000 "application " | TOPICAL_CREAM | Freq: Every day | CUTANEOUS | 0 refills | Status: DC

## 2019-04-13 MED ORDER — METHOCARBAMOL 500 MG PO TABS: 500.0000 mg | ORAL_TABLET | Freq: Two times a day (BID) | ORAL | 0 refills | Status: DC | PRN

## 2019-04-13 MED ORDER — NAPROXEN 375 MG PO TABS: 375.0000 mg | ORAL_TABLET | Freq: Two times a day (BID) | ORAL | 0 refills | Status: DC
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
## 2019-04-29 DIAGNOSIS — Z7251 High risk heterosexual behavior: Secondary | ICD-10-CM | POA: Diagnosis not present

## 2019-04-29 DIAGNOSIS — R1030 Lower abdominal pain, unspecified: Secondary | ICD-10-CM | POA: Diagnosis not present

## 2019-04-29 DIAGNOSIS — N390 Urinary tract infection, site not specified: Secondary | ICD-10-CM | POA: Diagnosis not present

## 2020-05-19 DIAGNOSIS — R8271 Bacteriuria: Secondary | ICD-10-CM | POA: Diagnosis not present

## 2020-05-19 DIAGNOSIS — Z202 Contact with and (suspected) exposure to infections with a predominantly sexual mode of transmission: Secondary | ICD-10-CM | POA: Diagnosis not present

## 2020-05-19 DIAGNOSIS — Z3202 Encounter for pregnancy test, result negative: Secondary | ICD-10-CM | POA: Diagnosis not present

## 2020-05-25 ENCOUNTER — Emergency Department (HOSPITAL_COMMUNITY): Admission: EM | Admit: 2020-05-25 | Payer: BC Managed Care – PPO | Source: Home / Self Care

## 2020-05-25 DIAGNOSIS — R100 Acute abdomen: Secondary | ICD-10-CM | POA: Diagnosis not present

## 2020-05-28 ENCOUNTER — Other Ambulatory Visit: Payer: Self-pay

## 2020-05-28 ENCOUNTER — Encounter: Payer: Self-pay | Admitting: Primary Care

## 2020-05-28 ENCOUNTER — Ambulatory Visit (INDEPENDENT_AMBULATORY_CARE_PROVIDER_SITE_OTHER): Payer: BC Managed Care – PPO | Admitting: Primary Care

## 2020-05-28 VITALS — BP 100/62 | HR 82 | Temp 97.6°F | Ht 66.0 in | Wt 132.0 lb

## 2020-05-28 DIAGNOSIS — R102 Pelvic and perineal pain: Secondary | ICD-10-CM | POA: Diagnosis not present

## 2020-05-28 DIAGNOSIS — Z1159 Encounter for screening for other viral diseases: Secondary | ICD-10-CM

## 2020-05-28 DIAGNOSIS — Z114 Encounter for screening for human immunodeficiency virus [HIV]: Secondary | ICD-10-CM | POA: Diagnosis not present

## 2020-05-28 LAB — POC URINALSYSI DIPSTICK (AUTOMATED)
Bilirubin, UA: NEGATIVE
Blood, UA: NEGATIVE
Glucose, UA: NEGATIVE
Ketones, UA: NEGATIVE
Leukocytes, UA: NEGATIVE
Nitrite, UA: NEGATIVE
Protein, UA: NEGATIVE
Spec Grav, UA: 1.015 (ref 1.010–1.025)
Urobilinogen, UA: 0.2 E.U./dL
pH, UA: 6 (ref 5.0–8.0)

## 2020-05-28 NOTE — Patient Instructions (Signed)
Stop by the front desk and speak with either Ashtyn or Brooke regarding your ultrasound.  Stop by the lab prior to leaving today. I will notify you of your results once received.   It was a pleasure to see you today!

## 2020-05-28 NOTE — Progress Notes (Signed)
Subjective:    Patient ID: Chelsea Ellis, female    DOB: 2001-03-13, 18 y.o.   MRN: [id number]  HPI  This visit occurred during the SARS-CoV-2 public health emergency.  Safety protocols were in place, including screening questions prior to the visit, additional usage of staff PPE, and extensive cleaning of exam room while observing appropriate contact time as indicated for disinfecting solutions.   Chelsea Ellis is a 19 year old female with a history of IBS, anxiety and depression, frequent headaches, cellulitis who presents today with a chief complaint of abdominal pain.  Her pain began about two weeks ago. She was evaluated at two urgent care centers including [facility] and [facility].   Her most recent visit was on 05/25/20 at [facility] where she endorsed symptoms of dysuria, urinary frequency, vaginal discharge, pelvic pain, vaginal bleeding, vaginal pain that began one week prior.   She was evaluated and treated one week prior [facility]) for STD exposure and "UTI" with antibiotics. Also urine pregnancy test was negative.   During her most recent visit at [facility] she was instructed to go to the nearest emergency department. This was based off of presentation and HPI. She presented to [facility] that same day but didn't check in for evaluation.   Today she endorses bilateral upper pelvic pain, vomiting, nausea, diarrhea, constipation. She completed her antibiotic that was prescribed about 10 days ago. She's unable to eat or drink much due to nausea and abdominal pain. Her vaginal discharge has improved, and her vaginal spotting has resolved.   Review of Systems  Constitutional: Positive for fatigue. Negative for fever.  Gastrointestinal: Positive for abdominal pain, constipation, diarrhea, nausea and vomiting.       Past Medical History:  Diagnosis Date  . Depression   . Frequent UTI   . Headache(784.0)   . Iron deficiency anemia   . Migraine      Social History    Socioeconomic History  . Marital status: Single    Spouse name: Not on file  . Number of children: Not on file  . Years of education: Not on file  . Highest education level: Not on file  Occupational History  . Not on file  Tobacco Use  . Smoking status: Never Smoker  . Smokeless tobacco: Never Used  Substance and Sexual Activity  . Alcohol use: No  . Drug use: No  . Sexual activity: Never  Other Topics Concern  . Not on file  Social History Narrative   Gradulated [employer], working now    Enjoys Spanish and American History.   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    Enjoys listening to music, reading, spending time with friends.   Social Determinants of Health   Financial Resource Strain:   . Difficulty of Paying Living Expenses: Not on file  Food Insecurity:   . Worried About Programme researcher, broadcasting/film/video in the Last Year: Not on file  . Ran Out of Food in the Last Year: Not on file  Transportation Needs:   . Lack of Transportation (Medical): Not on file  . Lack of Transportation (Non-Medical): Not on file  Physical Activity:   . Days of Exercise per Week: Not on file  . Minutes of Exercise per Session: Not on file  Stress:   . Feeling of Stress : Not on file  Social Connections:   . Frequency of Communication with Friends and Family: Not on file  . Frequency of Social  Gatherings with Friends and Family: Not on file  . Attends Religious Services: Not on file  . Active Member of Clubs or Organizations: Not on file  . Attends Banker Meetings: Not on file  . Marital Status: Not on file  Intimate Partner Violence:   . Fear of Current or Ex-Partner: Not on file  . Emotionally Abused: Not on file  . Physically Abused: Not on file  . Sexually Abused: Not on file    Past Surgical History:  Procedure Laterality Date  . NO PAST SURGERIES      Family History  Problem Relation Age of Onset  . Migraines Mother   . Cancer Maternal Grandmother         Died at 70  . Migraines Maternal Grandmother   . Migraines Maternal Grandfather   . Migraines Maternal Aunt   . Migraines Maternal Uncle     Allergies  Allergen Reactions  . Sulfur Hives  . Sulfamethoxazole Hives    Current Outpatient Medications on File Prior to Visit  Medication Sig Dispense Refill  . norelgestromin-ethinyl estradiol (ORTHO EVRA) 150-35 MCG/24HR transdermal patch Apply one patch to the skin once weekly for three weeks, then remove for one week. (Patient not taking: Reported on 05/28/2020) 9 patch 1   No current facility-administered medications on file prior to visit.    BP 100/62   Pulse 82   Temp 97.6 F (36.4 C) (Temporal)   Ht 5\' 6"  (1.676 m)   Wt 132 lb (59.9 kg)   LMP 05/07/2020   SpO2 99%   BMI 21.31 kg/m    Objective:   Physical Exam Constitutional:      General: She is not in acute distress.    Appearance: She is not ill-appearing.  Cardiovascular:     Rate and Rhythm: Normal rate and regular rhythm.  Abdominal:     General: Abdomen is flat. Bowel sounds are normal.     Palpations: Abdomen is soft.     Tenderness: There is abdominal tenderness in the right upper quadrant, suprapubic area, left upper quadrant and left lower quadrant.     Comments: Most tenderness to bilateral pelvic region  Skin:    General: Skin is warm and dry.  Neurological:     Mental Status: She is alert.            Assessment & Plan:

## 2020-05-28 NOTE — Assessment & Plan Note (Addendum)
Acute for the last 10-14 days, has been evaluated at two urgent care centers for such.  Today she doesn't appear sickly or in distress. She is resting comfortably during visit. The majority of her tenderness is to the pelvic region. STD ruled out last week, also treated for UTI with what sounds to be cephalexin.   Given continued pelvic pain, we will obtain a transvaginal and pelvic ultrasound. Repeat UA today negative.

## 2020-05-29 ENCOUNTER — Ambulatory Visit
Admission: RE | Admit: 2020-05-29 | Discharge: 2020-05-29 | Disposition: A | Discharge status: Home / Self Care | Payer: BC Managed Care – PPO | Source: Ambulatory Visit | Attending: Primary Care | Admitting: Primary Care

## 2020-05-29 DIAGNOSIS — R102 Pelvic and perineal pain: Secondary | ICD-10-CM

## 2020-05-29 DIAGNOSIS — N854 Malposition of uterus: Secondary | ICD-10-CM | POA: Diagnosis not present

## 2020-05-30 ENCOUNTER — Telehealth: Payer: Self-pay | Admitting: Primary Care

## 2020-05-30 NOTE — Telephone Encounter (Signed)
Chelsea Ellis (mom) called  She wanted to know if Chelsea Ellis could  Call Chelsea Ellis regarding test results on my chart

## 2020-05-30 NOTE — Telephone Encounter (Signed)
We do not have DPR on file? Am I ok to call mom?

## 2020-05-30 NOTE — Telephone Encounter (Signed)
I would call patient.  See result note.

## 2020-05-31 ENCOUNTER — Encounter: Payer: Self-pay | Admitting: Primary Care

## 2020-05-31 ENCOUNTER — Telehealth: Payer: Self-pay | Admitting: Primary Care

## 2020-05-31 ENCOUNTER — Other Ambulatory Visit: Payer: Self-pay

## 2020-05-31 DIAGNOSIS — R102 Pelvic and perineal pain: Secondary | ICD-10-CM

## 2020-05-31 DIAGNOSIS — R1084 Generalized abdominal pain: Secondary | ICD-10-CM

## 2020-05-31 DIAGNOSIS — R112 Nausea with vomiting, unspecified: Secondary | ICD-10-CM

## 2020-05-31 NOTE — Telephone Encounter (Signed)
Spoke to patient letter available in my chart. Ok per [place] for 05/28/2020 to return on 06/01/2020.

## 2020-05-31 NOTE — Telephone Encounter (Signed)
Received an email where pt sent in a message via [employer]. It states "I am waiting receive a call back from Baron Hamper about my test results and see what the next step is. Also, I need a dr. note to return back to work. I am suppose to work tonight at [employer], if someone could call me please either at [phone] or [phone] it would be greatly appreciated. thank you"

## 2020-05-31 NOTE — Telephone Encounter (Signed)
Labs ordered. We just need to make sure in the referral that it states that she needs to be seen for generalized abdominal pain with nausea and vomiting.

## 2020-05-31 NOTE — Telephone Encounter (Signed)
Patient's mother called with patient on line as well. Stated PCP was wanting to know what location for GI patient is wanting to go. Patient's mother stated anywhere is fine. They are also wondering what else they can do to relieve pain in the meantime before they see the GI. Lastly they are wanting lab orders placed as patient did not do labs before leaving.

## 2020-05-31 NOTE — Telephone Encounter (Signed)
Referral has been started. Let me know what labs you want happy to order. Please advise on treatment

## 2020-06-03 MED ORDER — ONDANSETRON 4 MG PO TBDP: 4.0000 mg | ORAL_TABLET | Freq: Three times a day (TID) | ORAL | 0 refills | Status: DC | PRN

## 2020-06-03 NOTE — Telephone Encounter (Signed)
Called and spoke to patient. States the pain is becoming sharper and more painful. Rates her pain 9/10 right now. She would like to have something called into her pharmacy for nausea and vomiting. She would like to have CT ordered.

## 2020-06-03 NOTE — Telephone Encounter (Signed)
Pt's Mom called and lab appt is scheduled for Wednesday, 06/05/2020.  Pain is not decreasing and is currently at a 10 and is still nauseous/vomiting.  She is requesting something to be called in for the pain as well as for the nausea/vomiting.  Please advise.  Thank you!

## 2020-06-03 NOTE — Telephone Encounter (Signed)
Noted.  Prescription for Zofran sent to pharmacy earlier today. Stat CT abdomen pelvis order placed.

## 2020-06-03 NOTE — Telephone Encounter (Signed)
Please notify patient and her mother, if she is in that much pain in her abdomen then we need to proceed with a CT scan.  I can send in some antinausea medication, but we need to understand what we are treating.  Are they willing to proceed with CT?

## 2020-06-03 NOTE — Telephone Encounter (Signed)
V/m not set up. I have updated referral. Calling patient to make sure lab appointment has been made.

## 2020-06-04 ENCOUNTER — Ambulatory Visit: Payer: BC Managed Care – PPO

## 2020-06-05 ENCOUNTER — Ambulatory Visit
Admission: RE | Admit: 2020-06-05 | Discharge: 2020-06-05 | Disposition: A | Discharge status: Home / Self Care | Payer: BC Managed Care – PPO | Source: Ambulatory Visit | Attending: Primary Care | Admitting: Primary Care

## 2020-06-05 ENCOUNTER — Other Ambulatory Visit: Payer: Self-pay

## 2020-06-05 ENCOUNTER — Other Ambulatory Visit (INDEPENDENT_AMBULATORY_CARE_PROVIDER_SITE_OTHER): Payer: BC Managed Care – PPO

## 2020-06-05 DIAGNOSIS — R1084 Generalized abdominal pain: Secondary | ICD-10-CM

## 2020-06-05 DIAGNOSIS — R112 Nausea with vomiting, unspecified: Secondary | ICD-10-CM

## 2020-06-05 DIAGNOSIS — R109 Unspecified abdominal pain: Secondary | ICD-10-CM | POA: Diagnosis not present

## 2020-06-05 LAB — CBC
HCT: 37.3 % (ref 36.0–46.0)
Hemoglobin: 12.6 g/dL (ref 12.0–15.0)
MCHC: 33.7 g/dL (ref 30.0–36.0)
MCV: 100.5 fl — ABNORMAL HIGH (ref 78.0–100.0)
Platelets: 271 10*3/uL (ref 150.0–400.0)
RBC: 3.72 Mil/uL — ABNORMAL LOW (ref 3.87–5.11)
RDW: 15.5 % — ABNORMAL HIGH (ref 11.5–14.6)
WBC: 6.5 10*3/uL (ref 4.5–10.5)

## 2020-06-05 LAB — COMPREHENSIVE METABOLIC PANEL
ALT: 10 U/L (ref 0–35)
AST: 14 U/L (ref 0–37)
Albumin: 4.2 g/dL (ref 3.5–5.2)
Alkaline Phosphatase: 60 U/L (ref 39–117)
BUN: 12 mg/dL (ref 6–23)
CO2: 26 mEq/L (ref 19–32)
Calcium: 9.4 mg/dL (ref 8.4–10.5)
Chloride: 107 mEq/L (ref 96–112)
Creatinine, Ser: 0.84 mg/dL (ref 0.40–1.20)
GFR: 99.47 mL/min (ref >60.00)
Glucose, Bld: 59 mg/dL — ABNORMAL LOW (ref 70–99)
Potassium: 4.4 mEq/L (ref 3.5–5.1)
Sodium: 140 mEq/L (ref 135–145)
Total Bilirubin: 0.4 mg/dL (ref 0.2–1.2)
Total Protein: 6.6 g/dL (ref 6.0–8.3)

## 2020-06-05 LAB — LIPASE: Lipase: 51 U/L (ref 11.0–59.0)

## 2020-06-05 MED ORDER — IOHEXOL 350 MG/ML SOLN
75.0000 mL | Freq: Once | INTRAVENOUS | Status: AC | PRN
  Administered 2020-06-05: 60 mL via INTRAVENOUS

## 2020-06-06 DIAGNOSIS — R102 Pelvic and perineal pain: Secondary | ICD-10-CM

## 2020-06-18 ENCOUNTER — Encounter: Payer: Self-pay | Admitting: Obstetrics and Gynecology

## 2020-06-18 ENCOUNTER — Ambulatory Visit (INDEPENDENT_AMBULATORY_CARE_PROVIDER_SITE_OTHER): Payer: BC Managed Care – PPO | Admitting: Obstetrics and Gynecology

## 2020-06-18 ENCOUNTER — Other Ambulatory Visit: Payer: Self-pay

## 2020-06-18 VITALS — BP 116/70 | Ht 66.0 in | Wt 132.0 lb

## 2020-06-18 DIAGNOSIS — Z3009 Encounter for other general counseling and advice on contraception: Secondary | ICD-10-CM

## 2020-06-18 DIAGNOSIS — R1032 Left lower quadrant pain: Secondary | ICD-10-CM | POA: Diagnosis not present

## 2020-06-18 MED ORDER — NORETHINDRONE ACET-ETHINYL EST 1-20 MG-MCG PO TABS: 1.0000 | ORAL_TABLET | Freq: Every day | ORAL | 4 refills | Status: DC

## 2020-06-18 NOTE — Progress Notes (Signed)
Chelsea Ellis Mar 26, 2001 [id number]  SUBJECTIVE:  19 y.o. G0 female presents as a referral from her primary care provider for evaluation of left lower quadrant her pain episode abdominal pain.  She is accompanied by her mother today. Her pain episode started with sudden onset of severe LLQ pain about 4 weeks ago, she did not recall what she was doing at the time that it started.  Does not associate the pain with menstruation.  Has had a more mild version of this pain in the past but nothing that lasted as long as this episode did nor was it as severe.  She did note that moving around made it worse.  Was in very severe pain for about 2 weeks and was unable to go to work because of it. Since then pain has been less intense and less severe/sharp, feels more a dull ache now.  The discomfort is still always there, but much less intense and is manageable at this point.  She went to urgent care and had evaluation on different occasions and ultimately was sent for pelvic imaging.  Blood work was unremarkable as well as urinalysis.  Unremarkable pelvic imaging by CT and ultrasound within the last 2 weeks as noted.  Currently on her menstrual period. LLQ pain x 4 weeks.  Has had similar pain less intense and consistent in the past.   Stopped taking birth control patch a year ago, was on it for 6 months.  Feels the patch made her moody and very hormonal.  Was previously on various birth control pills but was having trouble remembering to take them.  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   Current Outpatient Medications  Medication Sig Dispense Refill  . norelgestromin-ethinyl estradiol (ORTHO EVRA) 150-35 MCG/24HR transdermal patch Apply one patch to the skin once weekly for three weeks, then remove for one week. (Patient not taking: Reported on 05/28/2020) 9 patch 1  . ondansetron (ZOFRAN ODT) 4 MG disintegrating tablet Take 1 tablet (4 mg total) by mouth every 8 (eight) hours as needed for  nausea or vomiting. 15 tablet 0   No current facility-administered medications for this visit.   Allergies: Sulfur and Sulfamethoxazole  Patient's last menstrual period was 06/16/2020.  Past medical history,surgical history, problem list, medications, allergies, family history and social history were all reviewed and documented as reviewed in the EPIC chart.  ROS: Positives and negatives as reviewed in the HPI    OBJECTIVE:  Ht 5\' 6"  (1.676 m)   Wt 132 lb (59.9 kg)   LMP 06/16/2020   BMI 21.31 kg/m  The patient appears well, alert, oriented, in no distress. PELVIC EXAM: Deferred (patient preferred not to on menses and also notes drastic improvement in her pelvic pain symptoms)   ASSESSMENT:  19 y.o. G0 with transient episode of severe left lower quadrant pelvic pain  PLAN:  The patient's symptoms have improved.  The timeline and description of symptoms could be consistent with an ovarian cyst that ruptured and has since resolved.  There was note of some pelvic fluid on the pelvic imaging which is not abnormal but could also support this theory.  Since she is feeling better at this point we took this opportunity to discuss contraception which may also help reduce her risk of forming ovarian cysts in the future.  Since she felt symptoms that she attributed to hormones when on the birth control patch, we discussed low-dose oral contraceptive options and did not get into the LARC  options too much if she wanted to be on some she could discontinue if she did not like the side effects. I recommended starting a Loestrin 1/20 equivalent.  We discussed some of the potential side effects and risks, she is a smoker but young and healthy and otherwise active so should be low risk of thrombosis.  We discussed Sunday start method and possibility of unpredictable bleeding in the first few months of use of a new OCP but if it continues beyond that we may need to consider changing the dose/formulation.  If  she were to develop any recurrent pain episodes in the future she should return for evaluation and if thought to be attributable to ovarian cyst formation then may need to consider continuous OCP dosing at that point.  Patient is comfortable with this plan and will follow up with any problems or concerns.   Theresia Majors MD 06/18/20

## 2020-08-07 ENCOUNTER — Other Ambulatory Visit: Payer: Self-pay

## 2020-08-07 ENCOUNTER — Ambulatory Visit (INDEPENDENT_AMBULATORY_CARE_PROVIDER_SITE_OTHER): Payer: BC Managed Care – PPO | Admitting: Nurse Practitioner

## 2020-08-07 ENCOUNTER — Encounter: Payer: Self-pay | Admitting: Nurse Practitioner

## 2020-08-07 DIAGNOSIS — Z3009 Encounter for other general counseling and advice on contraception: Secondary | ICD-10-CM

## 2020-08-07 DIAGNOSIS — N83202 Unspecified ovarian cyst, left side: Secondary | ICD-10-CM

## 2020-08-07 DIAGNOSIS — R300 Dysuria: Secondary | ICD-10-CM | POA: Diagnosis not present

## 2020-08-07 DIAGNOSIS — R1032 Left lower quadrant pain: Secondary | ICD-10-CM

## 2020-08-07 DIAGNOSIS — Z7251 High risk heterosexual behavior: Secondary | ICD-10-CM | POA: Diagnosis not present

## 2020-08-07 DIAGNOSIS — N760 Acute vaginitis: Secondary | ICD-10-CM

## 2020-08-07 DIAGNOSIS — B9689 Other specified bacterial agents as the cause of diseases classified elsewhere: Secondary | ICD-10-CM

## 2020-08-07 LAB — WET PREP FOR TRICH, YEAST, CLUE

## 2020-08-07 LAB — PREGNANCY, URINE: Preg Test, Ur: NEGATIVE

## 2020-08-07 MED ORDER — NORELGESTROMIN-ETH ESTRADIOL 150-35 MCG/24HR TD PTWK: 1.0000 | MEDICATED_PATCH | TRANSDERMAL | 3 refills | Status: DC

## 2020-08-07 MED ORDER — METRONIDAZOLE 0.75 % VA GEL: 1.0000 | Freq: Two times a day (BID) | VAGINAL | 0 refills | Status: DC

## 2020-08-07 NOTE — Progress Notes (Signed)
   Acute Office Visit  Subjective:    Patient ID: Chelsea Ellis, female    DOB: February 11, 2001, 19 y.o.   MRN: [id number]   HPI 19 y.o. presents today for LLQ abdominal pain. Was seen by PCP 05/28/2020 for pelvic pain with normal pelvic ultrasound and CT showing trace free fluid in pelvic but otherwise unremarkbale. She was seen by Dr. Penni Bombard 06/18/2020 for follow up. At that time her symptoms had resolved and was suggestive of ruptured ovarian cyst. At that visit she was started on Loestrin Fe for cyst management. She feels that her cysts were less often and severe when she was on Ortho Evra patch. Smoker. LMP in November (unsure of exact dates). Sexually active. Mother present during visit.    Review of Systems  Constitutional: Negative.   Gastrointestinal: Positive for abdominal pain. Negative for abdominal distention.  Genitourinary: Positive for vaginal discharge.       Objective:    Physical Exam Constitutional:      Appearance: Normal appearance.  Abdominal:     Tenderness: There is abdominal tenderness (L>R) in the right lower quadrant and left lower quadrant.  Genitourinary:    General: Normal vulva.     Vagina: Vaginal discharge present. No erythema.     Cervix: Normal.     LMP  (Approximate) Comment: LAST CYCLE IN NOV SOMETIME Wt Readings from Last 3 Encounters:  06/18/20 132 lb (59.9 kg)  05/28/20 132 lb (59.9 kg)  04/13/19 146 lb (66.2 kg) (77 %, Z= 0.72)*   * Growth percentiles are based on CDC (Girls, 2-20 Years) data.   Wet prep + clue cells UPT negative UA nitrite positive, 1+ leukocytes, wbc 10-20, rbc none, many bacteria, few clue cells present     Assessment & Plan:   Problem List Items Addressed This Visit   None   Visit Diagnoses    Left lower quadrant abdominal pain    -  Primary   Encounter for counseling regarding contraception       Relevant Medications   norelgestromin-ethinyl estradiol (ORTHO EVRA) 150-35 MCG/24HR transdermal patch    Unprotected sexual intercourse       Relevant Orders   Pregnancy, urine   Burning with urination       Relevant Orders   WET PREP FOR TRICH, YEAST, CLUE   Urinalysis w microscopic + reflex cultur   Cyst of left ovary       Bacterial vaginosis       Relevant Medications   metroNIDAZOLE (METROGEL) 0.75 % vaginal gel     Plan: Symptoms and timing consistent with functional ovarian cyst. Ortho Evra patch continuously with withdrawal every 3-4 months to help with cyst management. Wet prep positive for clue cells - Metrogel 0.75% twice daily x 4 days. UPT negative. Urine culture pending. She is agreeable to plan.      Chelsea Ellis [employer] 3:52 PM 08/07/2020

## 2020-08-07 NOTE — Patient Instructions (Signed)
Ovarian Cyst An ovarian cyst is a fluid-filled sac on an ovary. The ovaries are organs that make eggs in women. Most ovarian cysts go away on their own and are not cancerous (are benign). Some cysts need treatment. Follow these instructions at home:  Take over-the-counter and prescription medicines only as told by your doctor.  Do not drive or use heavy machinery while taking prescription pain medicine.  Get pelvic exams and Pap tests as often as told by your doctor.  Return to your normal activities as told by your doctor. Ask your doctor what activities are safe for you.  Do not use any products that contain nicotine or tobacco, such as cigarettes and e-cigarettes. If you need help quitting, ask your doctor.  Keep all follow-up visits as told by your doctor. This is important. Contact a doctor if:  Your periods are: ? Late. ? Irregular. ? Painful.   Your periods stop.  You have pelvic pain that does not go away.  You have pressure on your bladder.  You have trouble making your bladder empty when you pee (urinate).  You have pain during sex.  You have any of the following in your belly (abdomen): ? A feeling of fullness. ? Pressure. ? Discomfort. ? Pain that does not go away. ? Swelling.  You feel sick most of the time.  You have trouble pooping (have constipation).  You are not as hungry as usual (you lose your appetite).  You get very bad acne.  You start to have more hair on your body and face.  You are gaining weight or losing weight without changing your exercise and eating habits.  You think you may be pregnant. Get help right away if:  You have belly pain that is very bad or gets worse.  You cannot eat or drink without throwing up (vomiting).  You suddenly get a fever.  Your period is a lot heavier than usual. This information is not intended to replace advice given to you by your health care provider. Make sure you discuss any questions you have  with your health care provider. Document Revised: 07/16/2017 Document Reviewed: 01/05/2016 Elsevier Patient Education  2020 [employer] Inc[employer] Bacterial Vaginosis  Bacterial vaginosis is an infection of the vagina. It happens when too many normal germs (healthy bacteria) grow in the vagina. This infection puts you at risk for infections from sex (STIs). Treating this infection can lower your risk for some STIs. You should also treat this if you are pregnant. It can cause your baby to be born early. Follow these instructions at home: Medicines  Take over-the-counter and prescription medicines only as told by your doctor.  Take or use your antibiotic medicine as told by your doctor. Do not stop taking or using it even if you start to feel better. General instructions  If you your sexual partner is a woman, tell her that you have this infection. She needs to get treatment if she has symptoms. If you have a female partner, he does not need to be treated.  During treatment: ? Avoid sex. ? Do not douche. ? Avoid alcohol as told. ? Avoid breastfeeding as told.  Drink enough fluid to keep your pee (urine) clear or pale yellow.  Keep your vagina and butt (rectum) clean. ? Wash the area with warm water every day. ? Wipe from front to back after you use the toilet.  Keep all follow-up visits as told by your doctor. This is important. Preventing this condition  Do not douche.  Use only warm water to wash around your vagina.  Use protection when you have sex. This includes: ? Latex condoms. ? Dental dams.  Limit how many people you have sex with. It is best to only have sex with the same person (be monogamous).  Get tested for STIs. Have your partner get tested.  Wear underwear that is cotton or lined with cotton.  Avoid tight pants and pantyhose. This is most important in summer.  Do not use any products that have nicotine or tobacco in them. These include cigarettes and e-cigarettes. If  you need help quitting, ask your doctor.  Do not use illegal drugs.  Limit how much alcohol you drink. Contact a doctor if:  Your symptoms do not get better, even after you are treated.  You have more discharge or pain when you pee (urinate).  You have a fever.  You have pain in your belly (abdomen).  You have pain with sex.  Your bleed from your vagina between periods. Summary  This infection happens when too many germs (bacteria) grow in the vagina.  Treating this condition can lower your risk for some infections from sex (STIs).  You should also treat this if you are pregnant. It can cause early (premature) birth.  Do not stop taking or using your antibiotic medicine even if you start to feel better. This information is not intended to replace advice given to you by your health care provider. Make sure you discuss any questions you have with your health care provider. Document Revised: 07/16/2017 Document Reviewed: 04/18/2016 Elsevier Patient Education  2020 [employer].
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
## 2020-08-10 LAB — URINALYSIS W MICROSCOPIC + REFLEX CULTURE
Bilirubin Urine: NEGATIVE
Glucose, UA: NEGATIVE
Hgb urine dipstick: NEGATIVE
Hyaline Cast: NONE SEEN /LPF
Ketones, ur: NEGATIVE
Nitrites, Initial: POSITIVE — AB
Protein, ur: NEGATIVE
RBC / HPF: NONE SEEN /HPF (ref 0–2)
Specific Gravity, Urine: 1.02 (ref 1.001–1.03)
pH: 7 (ref 5.0–8.0)

## 2020-08-10 LAB — CULTURE INDICATED

## 2020-08-10 LAB — URINE CULTURE
MICRO NUMBER:: 11348393
SPECIMEN QUALITY:: ADEQUATE

## 2020-08-12 ENCOUNTER — Other Ambulatory Visit: Payer: Self-pay | Admitting: Nurse Practitioner

## 2020-08-12 DIAGNOSIS — N3000 Acute cystitis without hematuria: Secondary | ICD-10-CM

## 2020-08-12 MED ORDER — NITROFURANTOIN MONOHYD MACRO 100 MG PO CAPS: 100.0000 mg | ORAL_CAPSULE | Freq: Two times a day (BID) | ORAL | 0 refills | Status: AC

## 2020-08-14 ENCOUNTER — Other Ambulatory Visit: Payer: Self-pay

## 2020-08-14 ENCOUNTER — Ambulatory Visit (INDEPENDENT_AMBULATORY_CARE_PROVIDER_SITE_OTHER): Payer: BC Managed Care – PPO | Admitting: Internal Medicine

## 2020-08-14 ENCOUNTER — Encounter: Payer: Self-pay | Admitting: Internal Medicine

## 2020-08-14 VITALS — BP 114/72 | HR 72 | Temp 98.1°F | Wt 132.0 lb

## 2020-08-14 DIAGNOSIS — N760 Acute vaginitis: Secondary | ICD-10-CM

## 2020-08-14 DIAGNOSIS — R102 Pelvic and perineal pain: Secondary | ICD-10-CM

## 2020-08-14 DIAGNOSIS — K581 Irritable bowel syndrome with constipation: Secondary | ICD-10-CM | POA: Diagnosis not present

## 2020-08-14 DIAGNOSIS — N3000 Acute cystitis without hematuria: Secondary | ICD-10-CM

## 2020-08-14 DIAGNOSIS — B9689 Other specified bacterial agents as the cause of diseases classified elsewhere: Secondary | ICD-10-CM

## 2020-08-14 MED ORDER — DICYCLOMINE HCL 10 MG PO CAPS: 10.0000 mg | ORAL_CAPSULE | Freq: Three times a day (TID) | ORAL | 0 refills | Status: DC

## 2020-08-14 NOTE — Patient Instructions (Signed)
Go ahead and switch from you pill to your patch- you may notice that you periods are irregular over the next few months You received a toradol shot today for pain I have sent in Bentyl to take 3 x day as needed for abdominal cramping- usually r/t IBS Try heat, hot baths- try to avoid NSAID's OTC or excessive medications if they are not effective Pick up Macrobid- start for UTI. Push water, avoid caffeinated beverages and alcohol Pick up Metrogel, use BID as prescribed. Follow up with your GYN or PCP if symptoms persist or worsen

## 2020-08-14 NOTE — Progress Notes (Signed)
Subjective:    Patient ID: Chelsea Ellis, female    DOB: Dec 28, 2000, 19 y.o.   MRN: [id number]  HPI  Pt presents to the clinic today with pelvic pain.  This has been an ongoing issue since Sept/Oct 2021. She had a normal pelvic ultrasound 05/29/2020.  CTA abdomen/pelvis from 06/05/2020 revealed a ring-enhancing corpus luteum of the right ovary. She describes the pain as cramping. She reports urinary frequency, dysuria, but denies urgency or blood in her urine. She reports some associated nausea but denies vomiting.  She is slightly constipated, history of IBS. She had a fever a few days ago but denies chills or body aches. She is having regular menstrual cycles, due next week. She was seen a GYN earlier this week, recently diagnosed with UTI and BV, prescribed Macrobid and Metrogel but she has not started on either of these medications.   Review of Systems      Past Medical History:  Diagnosis Date  . Depression   . Frequent UTI   . Headache(784.0)   . Iron deficiency anemia   . Migraine     Current Outpatient Medications  Medication Sig Dispense Refill  . metroNIDAZOLE (METROGEL) 0.75 % vaginal gel Place 1 Applicatorful vaginally 2 (two) times daily. 70 g 0  . nitrofurantoin, macrocrystal-monohydrate, (MACROBID) 100 MG capsule Take 1 capsule (100 mg total) by mouth 2 (two) times daily for 7 days. 14 capsule 0  . norelgestromin-ethinyl estradiol (ORTHO EVRA) 150-35 MCG/24HR transdermal patch Place 1 patch onto the skin once a week. Use continuously 12 patch 3  . ondansetron (ZOFRAN ODT) 4 MG disintegrating tablet Take 1 tablet (4 mg total) by mouth every 8 (eight) hours as needed for nausea or vomiting. (Patient not taking: No sig reported) 15 tablet 0   No current facility-administered medications for this visit.    Allergies  Allergen Reactions  . Sulfur Hives  . Sulfamethoxazole Hives    Family History  Problem Relation Age of Onset  . Migraines Mother   . Cancer  Maternal Grandmother        Died at 64-Cervical  . Migraines Maternal Grandmother   . Migraines Maternal Grandfather   . Migraines Maternal Aunt   . Migraines Maternal Uncle     Social History   Socioeconomic History  . Marital status: Single    Spouse name: Not on file  . Number of children: Not on file  . Years of education: Not on file  . Highest education level: Not on file  Occupational History  . Not on file  Tobacco Use  . Smoking status: Current Every Day Smoker    Types: Cigarettes  . Smokeless tobacco: Never Used  . Tobacco comment: Also vape  Vaping Use  . Vaping Use: Some days  Substance and Sexual Activity  . Alcohol use: No  . Drug use: No  . Sexual activity: Yes    Birth control/protection: Condom    Comment: 1st intercourse 19 yo-More than 5 partners  Other Topics Concern  . Not on file  Social History Narrative   Gradulated [employer], working now    Enjoys Spanish and American History.   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    Enjoys listening to music, reading, spending time with friends.   Social Determinants of Health   Financial Resource Strain: Not on file  Food Insecurity: Not on file  Transportation Needs: Not on file  Physical Activity: Not on file  Stress: Not on file  Social Connections: Not on file  Intimate Partner Violence: Not on file     Constitutional: Denies fever, malaise, fatigue, headache or abrupt weight changes.  Respiratory: Denies difficulty breathing, shortness of breath, cough or sputum production.   Cardiovascular: Denies chest pain, chest tightness, palpitations or swelling in the hands or feet.  Gastrointestinal: Pt reports nausea, abdominal cramping and constipation. Denies diarrhea or blood in the stool.  GU: Pt reports frequency and dysuria. Denies urgency,burning sensation, blood in urine, odor or discharge.  No other specific complaints in a complete review of systems (except as listed in HPI  above).  Objective:   Physical Exam  BP 114/72   Pulse 72   Temp 98.1 F (36.7 C) (Temporal)   Wt 132 lb (59.9 kg)   LMP 07/14/2020 (Approximate)   SpO2 98%   BMI 21.31 kg/m   Wt Readings from Last 3 Encounters:  06/18/20 132 lb (59.9 kg)  05/28/20 132 lb (59.9 kg)  04/13/19 146 lb (66.2 kg) (77 %, Z= 0.72)*   * Growth percentiles are based on CDC (Girls, 2-20 Years) data.    General: Appears her stated age, well developed, well nourished in NAD. Cardiovascular: Normal rate and rhythm. S1,S2 noted.  No murmur, rubs or gallops noted.  Pulmonary/Chest: Normal effort and positive vesicular breath sounds. No respiratory distress. No wheezes, rales or ronchi noted.  Abdomen: Soft and tender in the suprapubic and LLQ.Marland Kitchen Normal bowel sounds. No distention or masses noted. Liver, spleen and kidneys non palpable. Pelvic: Deferred. Neurological: Alert and oriented.    BMET    Component Value Date/Time   NA 140 06/05/2020 0956   K 4.4 06/05/2020 0956   CL 107 06/05/2020 0956   CO2 26 06/05/2020 0956   GLUCOSE 59 (L) 06/05/2020 0956   BUN 12 06/05/2020 0956   CREATININE 0.84 06/05/2020 0956   CALCIUM 9.4 06/05/2020 0956    Lipid Panel  No results found for: CHOL, TRIG, HDL, CHOLHDL, VLDL, LDLCALC  CBC    Component Value Date/Time   WBC 6.5 06/05/2020 0956   RBC 3.72 (L) 06/05/2020 0956   HGB 12.6 06/05/2020 0956   HCT 37.3 06/05/2020 0956   PLT 271.0 06/05/2020 0956   MCV 100.5 (H) 06/05/2020 0956   MCHC 33.7 06/05/2020 0956   RDW 15.5 (H) 06/05/2020 0956   LYMPHSABS 2.9 12/19/2015 0854   MONOABS 0.5 12/19/2015 0854   EOSABS 0.2 12/19/2015 0854   BASOSABS 0.0 12/19/2015 0854    Hgb A1C Lab Results  Component Value Date   HGBA1C 5.3 12/19/2015           Assessment & Plan:   Pelvic Pain, BV, UTI:  Current UTI and BC infection and has not started treatment Advised her to pick up Macrobid and Metrogel and get started on these Push water, avoid caffeine  and alcohol Toradol 30 mg IM today  IBS C:  Main c/o of cramping RX for Bentyl 10 mg TID prn  Return precautions discussed  Nicki Reaper, NP This visit occurred during the SARS-CoV-2 public health emergency.  Safety protocols were in place, including screening questions prior to the visit, additional usage of staff PPE, and extensive cleaning of exam room while observing appropriate contact time as indicated for disinfecting solutions.

## 2020-08-15 MED ORDER — KETOROLAC TROMETHAMINE 30 MG/ML IJ SOLN
30.0000 mg | Freq: Once | INTRAMUSCULAR | Status: AC
  Administered 2020-08-14: 30 mg via INTRAMUSCULAR

## 2020-08-29 ENCOUNTER — Ambulatory Visit: Payer: BC Managed Care – PPO | Admitting: Internal Medicine

## 2020-08-29 ENCOUNTER — Encounter: Payer: Self-pay | Admitting: *Deleted

## 2020-09-05 ENCOUNTER — Other Ambulatory Visit: Payer: Self-pay | Admitting: Internal Medicine

## 2021-07-02 ENCOUNTER — Other Ambulatory Visit: Payer: Self-pay | Admitting: Family Medicine

## 2021-07-02 DIAGNOSIS — O30009 Twin pregnancy, unspecified number of placenta and unspecified number of amniotic sacs, unspecified trimester: Secondary | ICD-10-CM

## 2021-07-07 ENCOUNTER — Other Ambulatory Visit: Payer: Self-pay

## 2021-07-07 DIAGNOSIS — B3731 Acute candidiasis of vulva and vagina: Secondary | ICD-10-CM | POA: Diagnosis not present

## 2021-07-07 DIAGNOSIS — Z3402 Encounter for supervision of normal first pregnancy, second trimester: Secondary | ICD-10-CM | POA: Diagnosis not present

## 2021-07-07 DIAGNOSIS — O30002 Twin pregnancy, unspecified number of placenta and unspecified number of amniotic sacs, second trimester: Secondary | ICD-10-CM | POA: Diagnosis not present

## 2021-07-07 DIAGNOSIS — O0930 Supervision of pregnancy with insufficient antenatal care, unspecified trimester: Secondary | ICD-10-CM | POA: Diagnosis not present

## 2021-07-07 DIAGNOSIS — Z8659 Personal history of other mental and behavioral disorders: Secondary | ICD-10-CM | POA: Diagnosis not present

## 2021-07-07 LAB — OB RESULTS CONSOLE PLATELET COUNT: Platelets: 238

## 2021-07-07 LAB — CYTOLOGY - PAP
Pap: NEGATIVE
Urine Culture, OB: NEGATIVE

## 2021-07-07 LAB — OB RESULTS CONSOLE ABO/RH: RH Type: POSITIVE

## 2021-07-07 LAB — OB RESULTS CONSOLE HGB/HCT, BLOOD
HCT: 34 (ref 29–41)
Hemoglobin: 11.5

## 2021-07-07 LAB — OB RESULTS CONSOLE GC/CHLAMYDIA
Chlamydia: NEGATIVE
Gonorrhea: NEGATIVE

## 2021-07-07 LAB — OB RESULTS CONSOLE HEPATITIS B SURFACE ANTIGEN: Hepatitis B Surface Ag: NEGATIVE

## 2021-07-07 LAB — OB RESULTS CONSOLE RUBELLA ANTIBODY, IGM: Rubella: IMMUNE

## 2021-07-07 LAB — HEPATITIS C ANTIBODY: HCV Ab: NEGATIVE

## 2021-07-07 LAB — OB RESULTS CONSOLE HIV ANTIBODY (ROUTINE TESTING): HIV: NONREACTIVE

## 2021-07-07 LAB — OB RESULTS CONSOLE ANTIBODY SCREEN: Antibody Screen: NEGATIVE

## 2021-07-07 LAB — OB RESULTS CONSOLE VARICELLA ZOSTER ANTIBODY, IGG: Varicella: IMMUNE

## 2021-07-07 LAB — OB RESULTS CONSOLE RPR: RPR: NONREACTIVE

## 2021-07-21 ENCOUNTER — Encounter: Payer: Self-pay | Admitting: *Deleted

## 2021-07-23 ENCOUNTER — Ambulatory Visit: Payer: BC Managed Care – PPO | Admitting: *Deleted

## 2021-07-23 ENCOUNTER — Other Ambulatory Visit: Payer: Self-pay

## 2021-07-23 ENCOUNTER — Encounter: Payer: Self-pay | Admitting: *Deleted

## 2021-07-23 ENCOUNTER — Ambulatory Visit: Payer: BC Managed Care – PPO | Attending: Obstetrics | Admitting: Obstetrics

## 2021-07-23 ENCOUNTER — Ambulatory Visit: Payer: BC Managed Care – PPO | Attending: Family Medicine

## 2021-07-23 VITALS — BP 123/77 | HR 114

## 2021-07-23 DIAGNOSIS — Z3A22 22 weeks gestation of pregnancy: Secondary | ICD-10-CM | POA: Diagnosis not present

## 2021-07-23 DIAGNOSIS — Z3689 Encounter for other specified antenatal screening: Secondary | ICD-10-CM | POA: Insufficient documentation

## 2021-07-23 DIAGNOSIS — O30042 Twin pregnancy, dichorionic/diamniotic, second trimester: Secondary | ICD-10-CM | POA: Diagnosis not present

## 2021-07-23 DIAGNOSIS — O30009 Twin pregnancy, unspecified number of placenta and unspecified number of amniotic sacs, unspecified trimester: Secondary | ICD-10-CM | POA: Diagnosis not present

## 2021-07-24 ENCOUNTER — Other Ambulatory Visit: Payer: Self-pay | Admitting: *Deleted

## 2021-07-24 DIAGNOSIS — O30042 Twin pregnancy, dichorionic/diamniotic, second trimester: Secondary | ICD-10-CM
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
## 2021-07-25 ENCOUNTER — Encounter: Payer: Self-pay | Admitting: *Deleted

## 2021-07-25 DIAGNOSIS — F32A Depression, unspecified: Secondary | ICD-10-CM

## 2021-07-25 DIAGNOSIS — O0990 Supervision of high risk pregnancy, unspecified, unspecified trimester: Secondary | ICD-10-CM | POA: Insufficient documentation

## 2021-07-25 DIAGNOSIS — F909 Attention-deficit hyperactivity disorder, unspecified type: Secondary | ICD-10-CM

## 2021-07-25 DIAGNOSIS — O30049 Twin pregnancy, dichorionic/diamniotic, unspecified trimester: Secondary | ICD-10-CM | POA: Insufficient documentation

## 2021-07-25 DIAGNOSIS — O289 Unspecified abnormal findings on antenatal screening of mother: Secondary | ICD-10-CM

## 2021-07-28 ENCOUNTER — Other Ambulatory Visit: Payer: Self-pay

## 2021-07-28 ENCOUNTER — Ambulatory Visit (INDEPENDENT_AMBULATORY_CARE_PROVIDER_SITE_OTHER): Payer: BC Managed Care – PPO | Admitting: Family Medicine

## 2021-07-28 VITALS — BP 122/80 | HR 110 | Wt 158.6 lb

## 2021-07-28 DIAGNOSIS — O30042 Twin pregnancy, dichorionic/diamniotic, second trimester: Secondary | ICD-10-CM | POA: Diagnosis not present

## 2021-07-28 DIAGNOSIS — O0990 Supervision of high risk pregnancy, unspecified, unspecified trimester: Secondary | ICD-10-CM | POA: Diagnosis not present

## 2021-07-28 DIAGNOSIS — O283 Abnormal ultrasonic finding on antenatal screening of mother: Secondary | ICD-10-CM

## 2021-07-28 NOTE — Progress Notes (Signed)
Subjective:   Chelsea Ellis is a 20 y.o. G1P0000 at [redacted]w[redacted]d by ultrasound being seen today for her first obstetrical visit at our office after transferring from [facility] (for twin gestation)  Her obstetrical history is significant for  twin gestation . Patient does intend to breast feed. Pregnancy history fully reviewed.  Patient reports no complaints.  HISTORY: OB History  Gravida Para Term Preterm AB Living  1 0 0 0 0 0  SAB IAB Ectopic Multiple Live Births  0 0 0 0 0    # Outcome Date GA Lbr Len/2nd Weight Sex Delivery Anes PTL Lv  1 Current            Last pap smear was  07/07/2021 (at [facility]) and was normal Past Medical History:  Diagnosis Date   ADHD    Anxiety    Depression    Frequent UTI    Headache(784.0)    Iron deficiency anemia    Migraine    Past Surgical History:  Procedure Laterality Date   NO PAST SURGERIES     Family History  Problem Relation Age of Onset   Migraines Mother    Seizures Mother    Diabetes Father    Hypertension Father    Migraines Maternal Aunt    Migraines Maternal Uncle    Cancer Maternal Grandmother        Died at 64-Cervical   Migraines Maternal Grandmother    Migraines Maternal Grandfather    Social History   Tobacco Use   Smoking status: Every Day    Types: Cigarettes   Smokeless tobacco: Never   Tobacco comments:    Also vape  Vaping Use   Vaping Use: Some days   Substances: Flavoring  Substance Use Topics   Alcohol use: No   Drug use: No   Allergies  Allergen Reactions   Elemental Sulfur Hives   Sulfamethoxazole Hives   Current Outpatient Medications on File Prior to Visit  Medication Sig Dispense Refill   ASPIRIN 81 PO Take by mouth.     Prenatal Vit-Fe Fumarate-FA (PRENATAL MULTIVITAMIN) TABS tablet Take 1 tablet by mouth daily at 12 noon.     No current facility-administered medications on file prior to visit.     Exam   Vitals:   07/28/21 1008  BP: 122/80  Pulse: (!) 110  Weight: 158 lb 9.6  oz (71.9 kg)   Fetal Heart Rate (bpm): 135/140  System: General: well-developed, well-nourished female in no acute distress   Skin: normal coloration and turgor, no rashes   Neurologic: oriented, normal, negative, normal mood   Extremities: normal strength, tone, and muscle mass, ROM of all joints is normal   Cardiovascular: Breathing comfortably in room air   Respiratory:  no respiratory distress   Abdomen: soft, non-tender     Assessment:   Pregnancy: G1P0000 Patient Active Problem List   Diagnosis Date Noted   Supervision of high risk pregnancy, antepartum 07/25/2021   Abnormal findings on prenatal screening 07/25/2021   Twin pregnancy, twins dichorionic and diamniotic 07/25/2021   ADHD 07/25/2021   Pelvic pain 05/28/2020   Eye swelling, right 12/09/2018   Hordeolum externum of right upper eyelid 12/09/2018   Dyshidrotic eczema 09/22/2018   Cellulitis due to Staphylococcus 09/15/2018   Preventative health care 10/30/2016   IBS (irritable bowel syndrome) 03/05/2016   Appetite loss 12/11/2015   Anxiety and depression 12/11/2015   Frequent headaches 12/11/2015   Encounter for birth control 12/11/2015  Plan:  1. Supervision of high risk pregnancy, antepartum No acute concerns today. Discussed in detail regarding the specifics of prenatal care and answered questions for patient and her mother.  - AFP offered. Declines  2. Dichorionic diamniotic twin pregnancy in second trimester Spontaneously conceived twins. Seen by MFM. Di/di on [place]. 19 % discrepancy (twin A 61%ile, twin B 10%ile) - plan for growth [place], next on 08/15/2021 - Fetal testing starting at 34 weeks - plan for delivery around 38 weeks  3. Intracardiac echogenic focus of Twin B Follow up Ultrasound pending. Patient offered amniocentesis by MFM, declined.    Initial labs drawn. Continue prenatal vitamins. Genetic Screening discussed, First trimester screen, Quad screen, and NIPS: ordered. Ultrasound  discussed; fetal anatomic survey: results reviewed. Problem list reviewed and updated. The nature of [facility] with multiple MDs and other Advanced Practice Providers was explained to patient; also emphasized that residents, students are part of our team. Routine obstetric precautions reviewed. Return in about 4 weeks (around 08/25/2021) for HROB, any MD/DO and also 28 week labs.  Warner Mccreedy, MD, MPH OB Fellow, Faculty Practice

## 2021-08-15 ENCOUNTER — Other Ambulatory Visit: Payer: Self-pay

## 2021-08-15 ENCOUNTER — Ambulatory Visit: Payer: BC Managed Care – PPO | Admitting: *Deleted

## 2021-08-15 ENCOUNTER — Other Ambulatory Visit: Payer: Self-pay | Admitting: *Deleted

## 2021-08-15 ENCOUNTER — Ambulatory Visit: Payer: BC Managed Care – PPO | Attending: Obstetrics

## 2021-08-15 ENCOUNTER — Other Ambulatory Visit: Payer: Self-pay | Admitting: Obstetrics

## 2021-08-15 VITALS — BP 131/84 | HR 88

## 2021-08-15 DIAGNOSIS — O365920 Maternal care for other known or suspected poor fetal growth, second trimester, not applicable or unspecified: Secondary | ICD-10-CM | POA: Insufficient documentation

## 2021-08-15 DIAGNOSIS — O0990 Supervision of high risk pregnancy, unspecified, unspecified trimester: Secondary | ICD-10-CM | POA: Insufficient documentation

## 2021-08-15 DIAGNOSIS — O289 Unspecified abnormal findings on antenatal screening of mother: Secondary | ICD-10-CM

## 2021-08-15 DIAGNOSIS — Z3A23 23 weeks gestation of pregnancy: Secondary | ICD-10-CM | POA: Diagnosis not present

## 2021-08-15 DIAGNOSIS — Z3A25 25 weeks gestation of pregnancy: Secondary | ICD-10-CM | POA: Diagnosis not present

## 2021-08-15 DIAGNOSIS — O35BXX2 Maternal care for other (suspected) fetal abnormality and damage, fetal cardiac anomalies, fetus 2: Secondary | ICD-10-CM | POA: Diagnosis not present

## 2021-08-15 DIAGNOSIS — O30002 Twin pregnancy, unspecified number of placenta and unspecified number of amniotic sacs, second trimester: Secondary | ICD-10-CM

## 2021-08-15 DIAGNOSIS — O30042 Twin pregnancy, dichorionic/diamniotic, second trimester: Secondary | ICD-10-CM | POA: Insufficient documentation

## 2021-08-17 NOTE — L&D Delivery Note (Signed)
LABOR COURSE Patient was admitted in an advance stage of labor and delivered shortly after arrival to L&D.   Delivery Note L&D team, including CNM and Dr. Alysia Penna, present for patient arrival on unit. Delivery call initiated due to report of +2 station for Baby A.   Baby A: Head delivered LOT. No nuchal cord present. Shoulder and body delivered in usual fashion. At 0446 a viable female was delivered via Vaginal, Spontaneous (Presentation: LOT; LOA).  Infant with spontaneous cry, placed on mother's abdomen, dried and stimulated. Cord clamped x 2 immediately after delivery to allow for assessment by NICU team. Cord cut by patient's mother.    Baby B: Head delivered LOT. No nuchal cord present. Shoulder and body delivered in usual fashion. At 0457 a viable female was delivered via Vaginal, Spontaneous (Presentation: LOT; LOA).  Infant with spontaneous cry, placed on mother's abdomen, dried and stimulated. Cord clamped x 2 immediately after delivery to allow for assessment by NICU team. Cord cut by patient's mother.   Cord blood drawn. Placenta delivered spontaneously with gentle cord traction. Appears intact. Fundus firm with massage and Pitocin. Labia, perineum, vagina, and cervix inspected.   Baby A: APGAR:8, 9; weight: 1690g  .   Cord: 3VC with the following complications:N/A.    Baby B: APGAR:7, 9; weight: 1350g  .   Cord: 3VC with the following complications:N/A.    Anesthesia: None  Episiotomy: None Lacerations: Left periurethral abrasion, hemostatic. Right periurethral laceration noted to be wide and anterior. Repair advised. Discussed that laceration is anterior and closely mirrors abrasion on right side, CNM voiced concern for pain, possible adherence of lacerations to each other. Patient declines repair. CNM returned to bedside 30 min prior to patient's move to postpartum. Patient offered silver nitrate for lacerations. Patient declined   Suture Repair:  N/A Est. Blood Loss (mL):  100  Mom to postpartum.  Baby A and Baby B to NICU.  Clayton Bibles, [place] 10/11/21 6:54 AM

## 2021-08-25 ENCOUNTER — Other Ambulatory Visit: Payer: Self-pay

## 2021-08-25 DIAGNOSIS — O0990 Supervision of high risk pregnancy, unspecified, unspecified trimester: Secondary | ICD-10-CM

## 2021-08-27 ENCOUNTER — Other Ambulatory Visit: Payer: Self-pay

## 2021-08-27 ENCOUNTER — Other Ambulatory Visit: Payer: BC Managed Care – PPO

## 2021-08-27 ENCOUNTER — Ambulatory Visit (INDEPENDENT_AMBULATORY_CARE_PROVIDER_SITE_OTHER): Payer: BC Managed Care – PPO | Admitting: Obstetrics and Gynecology

## 2021-08-27 VITALS — BP 128/76 | HR 90 | Wt 179.0 lb

## 2021-08-27 DIAGNOSIS — O0990 Supervision of high risk pregnancy, unspecified, unspecified trimester: Secondary | ICD-10-CM

## 2021-08-27 DIAGNOSIS — O30043 Twin pregnancy, dichorionic/diamniotic, third trimester: Secondary | ICD-10-CM | POA: Diagnosis not present

## 2021-08-27 NOTE — Progress Notes (Signed)
° °  PRENATAL VISIT NOTE  Subjective:  Chelsea Ellis is a 21 y.o. G1P0000 at [redacted]w[redacted]d being seen today for ongoing prenatal care.  She is currently monitored for the following issues for this high-risk pregnancy and has Anxiety and depression; Frequent headaches; IBS (irritable bowel syndrome); Preventative health care; Dyshidrotic eczema; Supervision of high risk pregnancy, antepartum; Abnormal findings on prenatal screening; Twin pregnancy, twins dichorionic and diamniotic; and ADHD on their problem list.  Patient reports no complaints.  Contractions: Not present. Vag. Bleeding: None.  Movement: Present. Denies leaking of fluid.   The following portions of the patient's history were reviewed and updated as appropriate: allergies, current medications, past family history, past medical history, past social history, past surgical history and problem list.   Objective:   Vitals:   08/27/21 0833  BP: 128/76  Pulse: 90  Weight: 179 lb (81.2 kg)    Fetal Status:     Movement: Present     General:  Alert, oriented and cooperative. Patient is in no acute distress.  Skin: Skin is warm and dry. No rash noted.   Cardiovascular: Normal heart rate noted  Respiratory: Normal respiratory effort, no problems with respiration noted  Abdomen: Soft, gravid, appropriate for gestational age.  Pain/Pressure: Present     Pelvic: Cervical exam deferred        Extremities: Normal range of motion.  Edema: Trace  Mental Status: Normal mood and affect. Normal behavior. Normal judgment and thought content.   Assessment and Plan:  Pregnancy: G1P0000 at [redacted]w[redacted]d 1. Supervision of high risk pregnancy, antepartum - 28 wk labs today - Recommended tdap - she declines today but states she will get next time - She declines flu shot.  - Rh positive   2. Dichorionic diamniotic twin pregnancy in third trimester - BPP/NST at [place], delivery at 28 unless other indication arises  Preterm labor symptoms and general  obstetric precautions including but not limited to vaginal bleeding, contractions, leaking of fluid and fetal movement were reviewed in detail with the patient. Please refer to After Visit Summary for other counseling recommendations.   Return in about 3 weeks (around 09/17/2021) for OB VISIT, MD or APP.  Future Appointments  Date Time Provider [place]  08/27/2021  8:50 AM [facility] LAB [facility]  09/10/2021 11:00 AM [facility] NURSE [facility]  09/10/2021 11:15 AM [facility] US2 [facility]    Radene Gunning, MD

## 2021-08-28 ENCOUNTER — Other Ambulatory Visit: Payer: Self-pay | Admitting: Obstetrics and Gynecology

## 2021-08-28 DIAGNOSIS — D509 Iron deficiency anemia, unspecified: Secondary | ICD-10-CM | POA: Insufficient documentation

## 2021-08-28 DIAGNOSIS — O99019 Anemia complicating pregnancy, unspecified trimester: Secondary | ICD-10-CM | POA: Insufficient documentation

## 2021-08-28 DIAGNOSIS — O99013 Anemia complicating pregnancy, third trimester: Secondary | ICD-10-CM

## 2021-08-28 LAB — RPR: RPR Ser Ql: NONREACTIVE

## 2021-08-28 LAB — GLUCOSE TOLERANCE, 2 HOURS W/ 1HR
Glucose, 1 hour: 136 mg/dL (ref 70–179)
Glucose, 2 hour: 76 mg/dL (ref 70–152)
Glucose, Fasting: 85 mg/dL (ref 70–91)

## 2021-08-28 LAB — CBC
Hematocrit: 27.5 % — ABNORMAL LOW (ref 34.0–46.6)
Hemoglobin: 9.7 g/dL — ABNORMAL LOW (ref 11.1–15.9)
MCH: 34 pg — ABNORMAL HIGH (ref 26.6–33.0)
MCHC: 35.3 g/dL (ref 31.5–35.7)
MCV: 97 fL (ref 79–97)
Platelets: 289 10*3/uL (ref 150–450)
RBC: 2.85 x10E6/uL — ABNORMAL LOW (ref 3.77–5.28)
RDW: 11.2 % — ABNORMAL LOW (ref 11.7–15.4)
WBC: 13 10*3/uL — ABNORMAL HIGH (ref 3.4–10.8)

## 2021-08-28 LAB — ANTIBODY SCREEN: Antibody Screen: NEGATIVE

## 2021-08-28 LAB — HIV ANTIBODY (ROUTINE TESTING W REFLEX): HIV Screen 4th Generation wRfx: NONREACTIVE

## 2021-08-28 MED ORDER — FERROUS SULFATE 325 (65 FE) MG PO TABS: 325.0000 mg | ORAL_TABLET | ORAL | 3 refills | Status: DC

## 2021-09-10 ENCOUNTER — Inpatient Hospital Stay (HOSPITAL_COMMUNITY)
Admission: AD | Admit: 2021-09-10 | Discharge: 2021-09-10 | Disposition: A | Discharge status: Home / Self Care | Payer: BC Managed Care – PPO | Attending: Obstetrics and Gynecology | Admitting: Obstetrics and Gynecology

## 2021-09-10 ENCOUNTER — Ambulatory Visit: Payer: BC Managed Care – PPO

## 2021-09-10 NOTE — MAU Note (Signed)
Per registration, pt left prior to being triaged. AMA papers signed by registration.

## 2021-09-15 ENCOUNTER — Other Ambulatory Visit: Payer: Self-pay | Admitting: *Deleted

## 2021-09-15 ENCOUNTER — Ambulatory Visit: Payer: BC Managed Care – PPO | Attending: Obstetrics and Gynecology

## 2021-09-15 ENCOUNTER — Encounter: Payer: Self-pay | Admitting: *Deleted

## 2021-09-15 ENCOUNTER — Other Ambulatory Visit: Payer: Self-pay

## 2021-09-15 ENCOUNTER — Ambulatory Visit: Payer: BC Managed Care – PPO | Admitting: *Deleted

## 2021-09-15 VITALS — BP 138/79 | HR 119

## 2021-09-15 DIAGNOSIS — O43193 Other malformation of placenta, third trimester: Secondary | ICD-10-CM

## 2021-09-15 DIAGNOSIS — O0990 Supervision of high risk pregnancy, unspecified, unspecified trimester: Secondary | ICD-10-CM | POA: Insufficient documentation

## 2021-09-15 DIAGNOSIS — O30043 Twin pregnancy, dichorionic/diamniotic, third trimester: Secondary | ICD-10-CM

## 2021-09-15 DIAGNOSIS — O289 Unspecified abnormal findings on antenatal screening of mother: Secondary | ICD-10-CM

## 2021-09-15 DIAGNOSIS — O283 Abnormal ultrasonic finding on antenatal screening of mother: Secondary | ICD-10-CM

## 2021-09-15 DIAGNOSIS — O30002 Twin pregnancy, unspecified number of placenta and unspecified number of amniotic sacs, second trimester: Secondary | ICD-10-CM | POA: Diagnosis not present

## 2021-09-15 DIAGNOSIS — Z3A28 28 weeks gestation of pregnancy: Secondary | ICD-10-CM | POA: Diagnosis not present

## 2021-09-15 DIAGNOSIS — Z658 Other specified problems related to psychosocial circumstances: Secondary | ICD-10-CM

## 2021-09-15 NOTE — Progress Notes (Signed)
C/o " feeling anxious and depressed about current pregnancy and afterwards." Denies any physical abuse or feeling unsafe with partner. Also denies feelings of harming herself or babies. MFM MD made aware and message sent to [facility]

## 2021-09-17 ENCOUNTER — Encounter: Payer: BC Managed Care – PPO | Admitting: Nurse Practitioner

## 2021-09-22 NOTE — BH Specialist Note (Signed)
rescheduled

## 2021-09-25 IMAGING — US US PELVIS COMPLETE WITH TRANSVAGINAL
1 series · 14 of 25 positions shown · non-contrast
Comparison: None

CLINICAL DATA: Bilateral upper pelvic pain for 2 weeks, LMP 3 weeks
prior, exact date unknown



[Series 1: us pelvis complete with transvaginal · 0.25mm/px · 14 of 62 slices shown]
[im 1/62]
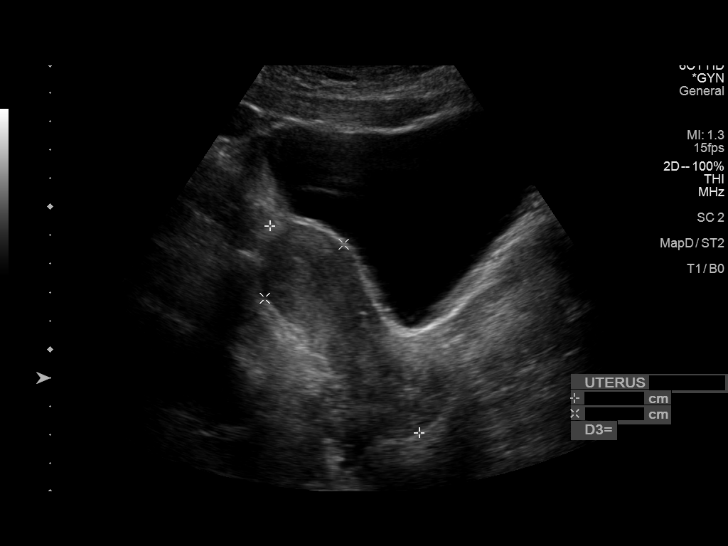
[im 6/62]
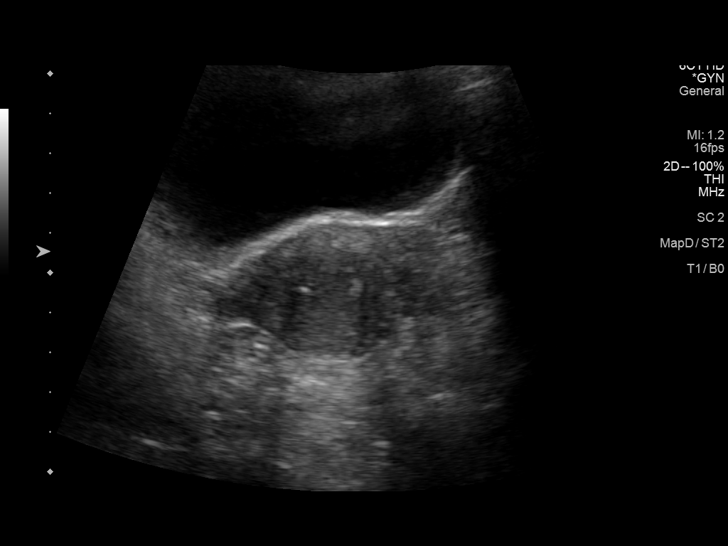
[im 11/62]
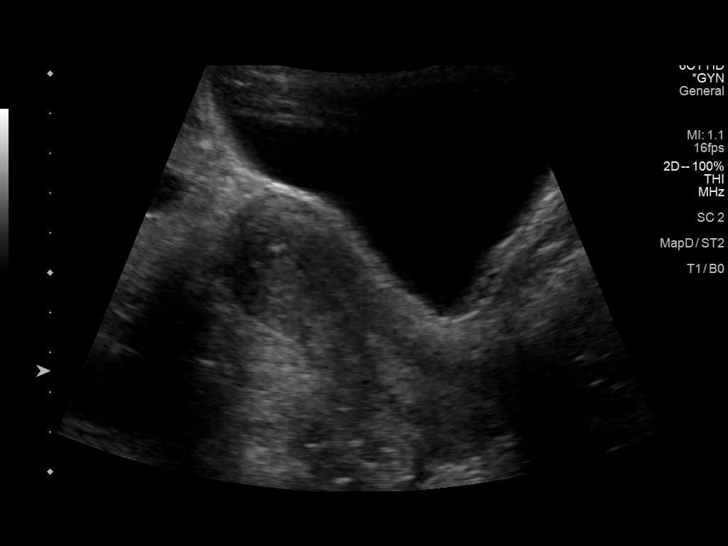
[im 16/62]
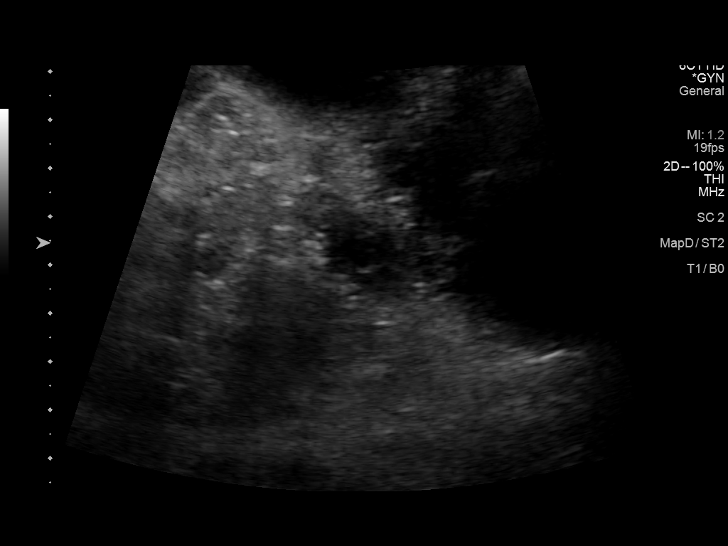
[im 21/62]
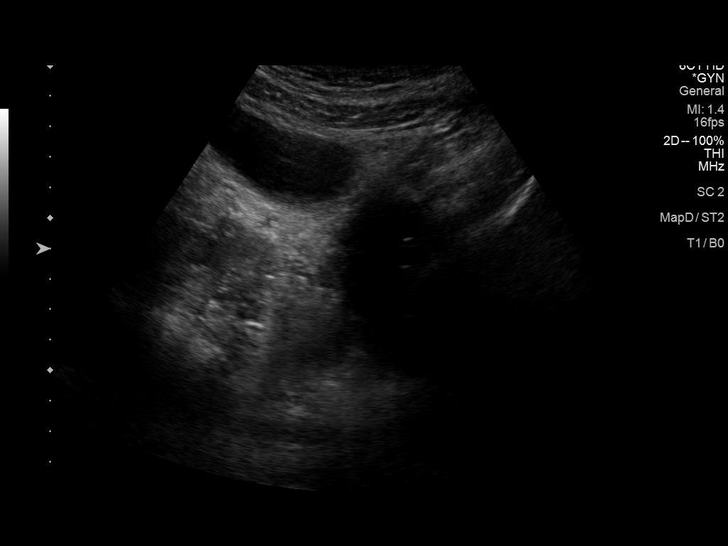
[im 23/62]
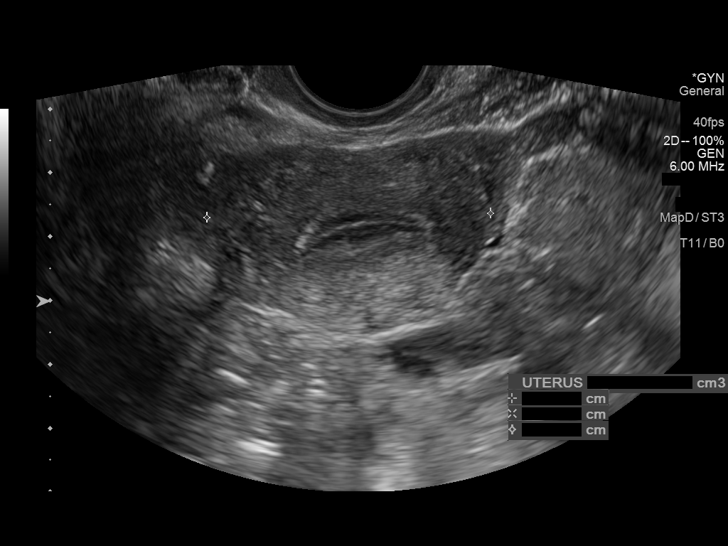
[im 28/62]
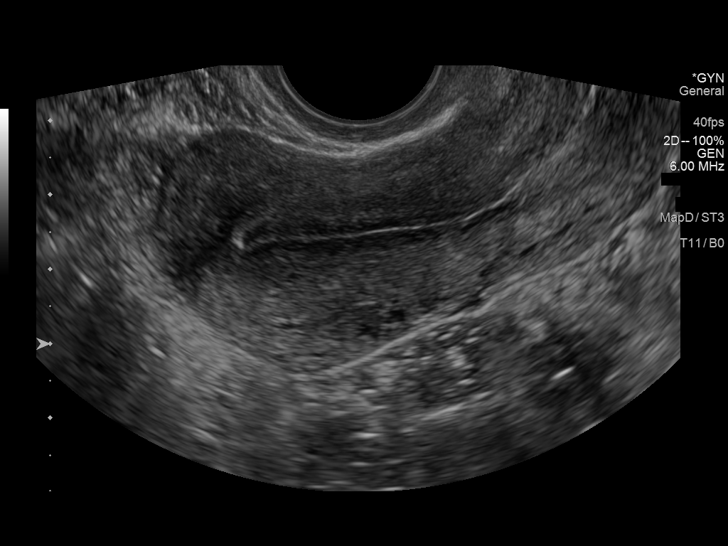
[im 34/62]
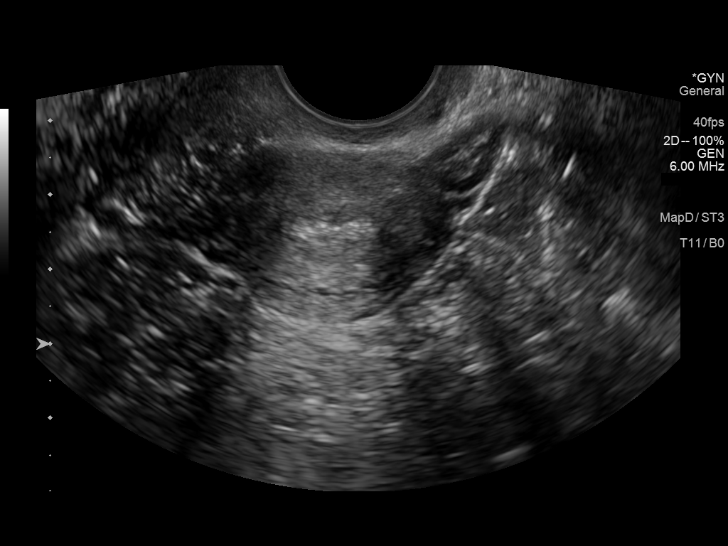
[im 39/62]
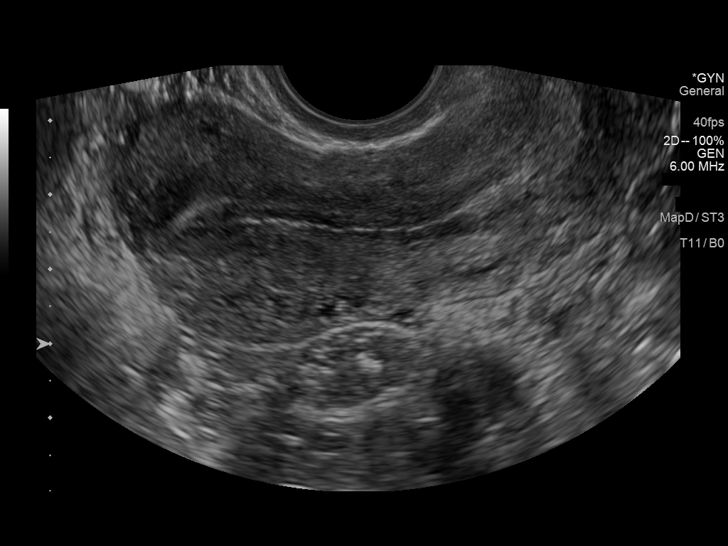
[im 41/62]
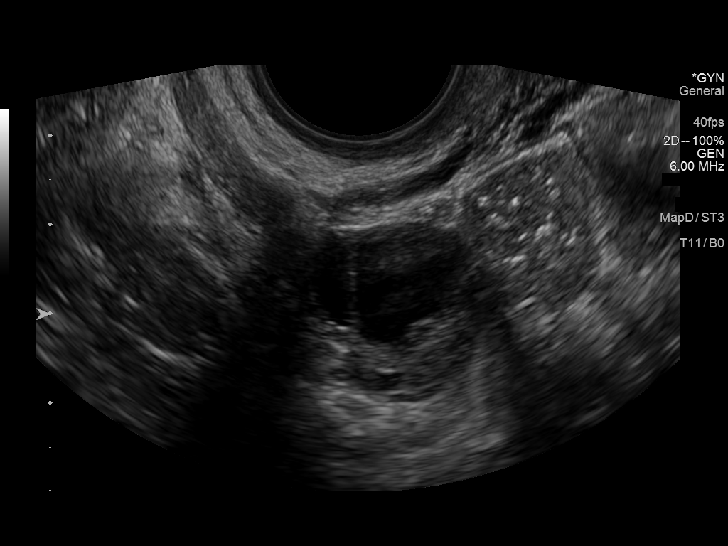
[im 46/62]
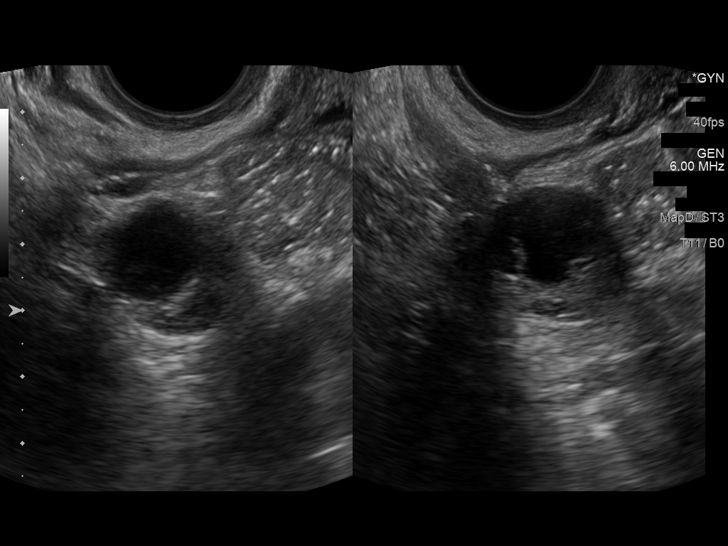
[im 51/62]
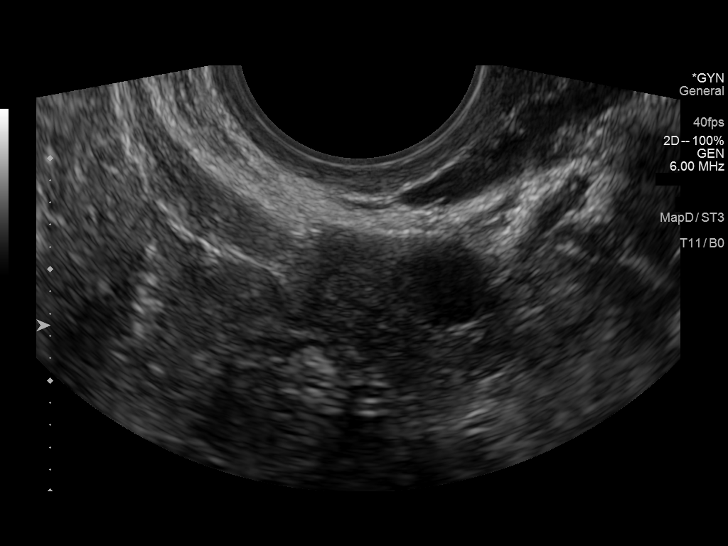
[im 56/62]
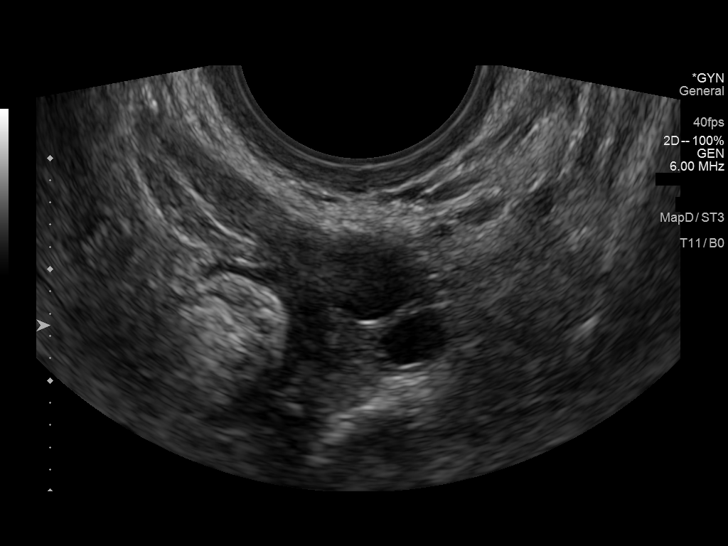
[im 62/62]
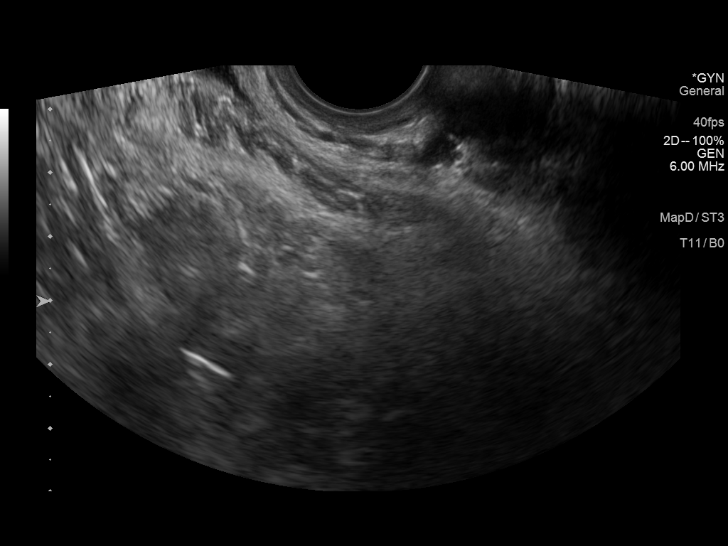

[14 of 25 positions shown; findings below may reference images not displayed]

FINDINGS: Uterus

Measurements: 8.9 x 3.4 x 4.3 cm = volume: 68 mL. Anteverted uterus.
No concerning uterine mass or visible fibroids.

Endometrium

Thickness: 7 mm, non thickened.  No focal abnormality visualized.

Right ovary

Measurements: 2.0 x 2.3 x 2.9 cm = volume: 7 mL. Normal anechoic
follicles. No concerning adnexal lesion.

Left ovary

Measurements: 1.3 x 1.5 x 1.2 cm = volume: 1.2 mL. Normal anechoic
follicles. No concerning adnexal lesion.

Other findings

No abnormal free fluid.
IMPRESSION: Unremarkable pelvic ultrasound.

## 2021-09-30 ENCOUNTER — Other Ambulatory Visit: Payer: Self-pay

## 2021-10-01 ENCOUNTER — Telehealth: Payer: Self-pay | Admitting: Family Medicine

## 2021-10-01 ENCOUNTER — Other Ambulatory Visit: Payer: Self-pay

## 2021-10-01 ENCOUNTER — Encounter (HOSPITAL_COMMUNITY): Payer: Self-pay | Admitting: Obstetrics & Gynecology

## 2021-10-01 ENCOUNTER — Inpatient Hospital Stay (HOSPITAL_COMMUNITY)
Admission: AD | Admit: 2021-10-01 | Discharge: 2021-10-01 | DRG: 833 | Disposition: A | Discharge status: Home / Self Care | Payer: BC Managed Care – PPO | Attending: Obstetrics & Gynecology | Admitting: Obstetrics & Gynecology

## 2021-10-01 DIAGNOSIS — O30043 Twin pregnancy, dichorionic/diamniotic, third trimester: Secondary | ICD-10-CM | POA: Diagnosis not present

## 2021-10-01 DIAGNOSIS — Z20822 Contact with and (suspected) exposure to covid-19: Secondary | ICD-10-CM | POA: Diagnosis present

## 2021-10-01 DIAGNOSIS — F32A Depression, unspecified: Secondary | ICD-10-CM

## 2021-10-01 DIAGNOSIS — O42913 Preterm premature rupture of membranes, unspecified as to length of time between rupture and onset of labor, third trimester: Secondary | ICD-10-CM | POA: Diagnosis present

## 2021-10-01 DIAGNOSIS — O2343 Unspecified infection of urinary tract in pregnancy, third trimester: Secondary | ICD-10-CM | POA: Diagnosis not present

## 2021-10-01 DIAGNOSIS — Z3A30 30 weeks gestation of pregnancy: Secondary | ICD-10-CM

## 2021-10-01 DIAGNOSIS — O0990 Supervision of high risk pregnancy, unspecified, unspecified trimester: Secondary | ICD-10-CM

## 2021-10-01 DIAGNOSIS — O42113 Preterm premature rupture of membranes, onset of labor more than 24 hours following rupture, third trimester: Secondary | ICD-10-CM | POA: Diagnosis present

## 2021-10-01 DIAGNOSIS — Z5329 Procedure and treatment not carried out because of patient's decision for other reasons: Secondary | ICD-10-CM | POA: Diagnosis not present

## 2021-10-01 DIAGNOSIS — Z7982 Long term (current) use of aspirin: Secondary | ICD-10-CM | POA: Diagnosis not present

## 2021-10-01 DIAGNOSIS — O99013 Anemia complicating pregnancy, third trimester: Secondary | ICD-10-CM | POA: Diagnosis not present

## 2021-10-01 DIAGNOSIS — O30049 Twin pregnancy, dichorionic/diamniotic, unspecified trimester: Secondary | ICD-10-CM | POA: Diagnosis present

## 2021-10-01 DIAGNOSIS — O289 Unspecified abnormal findings on antenatal screening of mother: Secondary | ICD-10-CM

## 2021-10-01 DIAGNOSIS — O99019 Anemia complicating pregnancy, unspecified trimester: Secondary | ICD-10-CM | POA: Diagnosis present

## 2021-10-01 DIAGNOSIS — D509 Iron deficiency anemia, unspecified: Secondary | ICD-10-CM | POA: Diagnosis not present

## 2021-10-01 DIAGNOSIS — F419 Anxiety disorder, unspecified: Secondary | ICD-10-CM

## 2021-10-01 DIAGNOSIS — Z87891 Personal history of nicotine dependence: Secondary | ICD-10-CM

## 2021-10-01 LAB — URINALYSIS, ROUTINE W REFLEX MICROSCOPIC
Bilirubin Urine: NEGATIVE
Glucose, UA: NEGATIVE mg/dL
Ketones, ur: NEGATIVE mg/dL
Nitrite: POSITIVE — AB
Protein, ur: 100 mg/dL — AB
Specific Gravity, Urine: 1.011 (ref 1.005–1.030)
WBC, UA: >50 WBC/hpf — ABNORMAL HIGH (ref 0–5)
pH: 6 (ref 5.0–8.0)

## 2021-10-01 LAB — WET PREP, GENITAL
Clue Cells Wet Prep HPF POC: NONE SEEN
Sperm: NONE SEEN
Trich, Wet Prep: NONE SEEN
WBC, Wet Prep HPF POC: >=10 — AB (ref <10)
Yeast Wet Prep HPF POC: NONE SEEN

## 2021-10-01 LAB — CBC
HCT: 29.9 % — ABNORMAL LOW (ref 36.0–46.0)
Hemoglobin: 10.4 g/dL — ABNORMAL LOW (ref 12.0–15.0)
MCH: 33.4 pg (ref 26.0–34.0)
MCHC: 34.8 g/dL (ref 30.0–36.0)
MCV: 96.1 fL (ref 80.0–100.0)
Platelets: 269 10*3/uL (ref 150–400)
RBC: 3.11 MIL/uL — ABNORMAL LOW (ref 3.87–5.11)
RDW: 12.7 % (ref 11.5–15.5)
WBC: 15.1 10*3/uL — ABNORMAL HIGH (ref 4.0–10.5)
nRBC: 0 % (ref 0.0–0.2)

## 2021-10-01 LAB — AMNISURE RUPTURE OF MEMBRANE (ROM) NOT AT ARMC: Amnisure ROM: POSITIVE

## 2021-10-01 LAB — TYPE AND SCREEN
ABO/RH(D): O POS
Antibody Screen: NEGATIVE

## 2021-10-01 LAB — RESP PANEL BY RT-PCR (FLU A&B, COVID) ARPGX2
Influenza A by PCR: NEGATIVE
Influenza B by PCR: NEGATIVE
SARS Coronavirus 2 by RT PCR: NEGATIVE

## 2021-10-01 LAB — ABO/RH: ABO/RH(D): O POS

## 2021-10-01 MED ORDER — FLEET ENEMA 7-19 GM/118ML RE ENEM: 1.0000 | ENEMA | RECTAL | Status: DC | PRN

## 2021-10-01 MED ORDER — MAGNESIUM SULFATE 50 % IJ SOLN
6.0000 g | Freq: Once | INTRAVENOUS | Status: DC
  Filled 2021-10-01: qty 12

## 2021-10-01 MED ORDER — ACETAMINOPHEN 325 MG PO TABS: 650.0000 mg | ORAL_TABLET | ORAL | Status: DC | PRN

## 2021-10-01 MED ORDER — OXYCODONE-ACETAMINOPHEN 5-325 MG PO TABS: 1.0000 | ORAL_TABLET | ORAL | Status: DC | PRN

## 2021-10-01 MED ORDER — MAGNESIUM SULFATE 40 GM/1000ML IV SOLN
6.0000 g/h | Freq: Once | INTRAVENOUS | Status: DC
Start: 2021-10-01 — End: 2021-10-01
  Administered 2021-10-01: 6 g/h via INTRAVENOUS

## 2021-10-01 MED ORDER — MAGNESIUM SULFATE 40 GM/1000ML IV SOLN
INTRAVENOUS | Status: AC
  Filled 2021-10-01: qty 1000

## 2021-10-01 MED ORDER — OXYTOCIN-SODIUM CHLORIDE 30-0.9 UT/500ML-% IV SOLN
2.5000 [IU]/h | INTRAVENOUS | Status: DC
  Filled 2021-10-01: qty 500

## 2021-10-01 MED ORDER — FENTANYL CITRATE (PF) 100 MCG/2ML IJ SOLN
100.0000 ug | INTRAMUSCULAR | Status: DC | PRN
Start: 2021-10-01 — End: 2021-10-02
  Administered 2021-10-01 (×3): 100 ug via INTRAVENOUS
  Filled 2021-10-01 (×3): qty 2

## 2021-10-01 MED ORDER — MAGNESIUM SULFATE 50 % IJ SOLN: 6.0000 g | Freq: Once | INTRAVENOUS | Status: DC

## 2021-10-01 MED ORDER — SOD CITRATE-CITRIC ACID 500-334 MG/5ML PO SOLN: 30.0000 mL | ORAL | Status: DC | PRN

## 2021-10-01 MED ORDER — SODIUM CHLORIDE 0.9 % IV SOLN: INTRAVENOUS | Status: DC

## 2021-10-01 MED ORDER — OXYTOCIN BOLUS FROM INFUSION
333.0000 mL | Freq: Once | INTRAVENOUS | Status: DC
Start: 2021-10-01 — End: 2021-10-02

## 2021-10-01 MED ORDER — SODIUM CHLORIDE 0.9 % IV SOLN
500.0000 mg | Freq: Once | INTRAVENOUS | Status: AC
  Administered 2021-10-01: 500 mg via INTRAVENOUS
  Filled 2021-10-01: qty 5

## 2021-10-01 MED ORDER — SODIUM CHLORIDE 0.9 % IV SOLN
1.0000 g | INTRAVENOUS | Status: DC
  Administered 2021-10-01: 1 g via INTRAVENOUS
  Filled 2021-10-01: qty 1000

## 2021-10-01 MED ORDER — LACTATED RINGERS IV SOLN
500.0000 mL | INTRAVENOUS | Status: DC | PRN
  Administered 2021-10-01: 1000 mL via INTRAVENOUS

## 2021-10-01 MED ORDER — SODIUM CHLORIDE 0.9 % IV SOLN
1.0000 g | Freq: Two times a day (BID) | INTRAVENOUS | Status: DC
  Administered 2021-10-01: 1 g via INTRAVENOUS
  Filled 2021-10-01 (×2): qty 10

## 2021-10-01 MED ORDER — LACTATED RINGERS IV SOLN: INTRAVENOUS | Status: DC

## 2021-10-01 MED ORDER — MAGNESIUM SULFATE 40 GM/1000ML IV SOLN
2.0000 g/h | INTRAVENOUS | Status: DC
  Administered 2021-10-01: 2 g/h via INTRAVENOUS

## 2021-10-01 MED ORDER — BETAMETHASONE SOD PHOS & ACET 6 (3-3) MG/ML IJ SUSP
12.0000 mg | Freq: Once | INTRAMUSCULAR | Status: AC
  Administered 2021-10-01: 12 mg via INTRAMUSCULAR
  Filled 2021-10-01: qty 5

## 2021-10-01 MED ORDER — SODIUM CHLORIDE 0.9 % IV SOLN
2.0000 g | Freq: Once | INTRAVENOUS | Status: AC
  Administered 2021-10-01: 2 g via INTRAVENOUS
  Filled 2021-10-01: qty 2000

## 2021-10-01 MED ORDER — FENTANYL CITRATE (PF) 100 MCG/2ML IJ SOLN
50.0000 ug | Freq: Once | INTRAMUSCULAR | Status: AC
Start: 2021-10-01 — End: 2021-10-01
  Administered 2021-10-01: 50 ug via INTRAVENOUS
  Filled 2021-10-01: qty 2

## 2021-10-01 MED ORDER — AMOXICILLIN 500 MG PO CAPS: 500.0000 mg | ORAL_CAPSULE | Freq: Three times a day (TID) | ORAL | 2 refills | Status: DC

## 2021-10-01 MED ORDER — OXYCODONE-ACETAMINOPHEN 5-325 MG PO TABS: 2.0000 | ORAL_TABLET | ORAL | Status: DC | PRN

## 2021-10-01 MED ORDER — MAGNESIUM SULFATE BOLUS VIA INFUSION
6.0000 g | Freq: Once | INTRAVENOUS | Status: DC
Start: 2021-10-01 — End: 2021-10-02
  Filled 2021-10-01: qty 1000

## 2021-10-01 MED ORDER — ONDANSETRON HCL 4 MG/2ML IJ SOLN: 4.0000 mg | Freq: Four times a day (QID) | INTRAMUSCULAR | Status: DC | PRN

## 2021-10-01 MED ORDER — LIDOCAINE HCL (PF) 1 % IJ SOLN: 30.0000 mL | INTRAMUSCULAR | Status: DC | PRN

## 2021-10-01 NOTE — Discharge Summary (Signed)
Antenatal Physician Discharge Summary - left against medical advice  Patient ID: Chelsea Ellis MRN: [id number] DOB/AGE: 23/02/2001 20 y.o.  Admit date: 10/01/2021 Discharge date: 10/01/2021  Admission and Discharge Diagnoses:  Principal Problem:   Preterm labor Active Problems:   Anxiety and depression   Left against medical advice   Supervision of high risk pregnancy, antepartum   Twin pregnancy, twins dichorionic and diamniotic   Anemia of pregnancy in third trimester   [redacted] weeks gestation of pregnancy  Prenatal Procedures: NST  Consults: Neonatology  Hospital Course:  PABLO FERKO is a 21 y.o. G1P0000 with di/di twin IUP at [redacted]w[redacted]d admitted for preterm labor.  She was admitted with frequent preterm contractions, noted to have a cervical exam of 5.5/80/-1; fetal presentation was cephalic/breech.   Had some leaking of fluid and Amnisure was positive concerning for PPROM. Also had symptoms of UTI.  She was initially started on magnesium sulfate for neuroprotection and also received betamethasone x 1  around 1300.  She received one dose of Rocephin, but after the Amnisure positive results, was given Ampicillin and Azithromycin for latency.  Her contractions spaced out and she had no pelvic pressure.  She was seen by Neonatology during her stay. No fevers or abdominal tenderness noted.  In the evening of the day of admission, patient decided she wanted to leave. She was cautioned against leaving given her PPROM and PTL with premature infants, risks of maternal-fetal morbidity and mortality were emphasized. She adamantly wanted to leave and was willing to sign AMA forms. These forms were signed. She was told to come back at any time, and at least tomorrow afternoon for second betamethasone dose and further evaluation.    Discharge Exam: Temp:  [97.6 F (36.4 C)-98.1 F (36.7 C)] 98.1 F (36.7 C) (02/15 1405) Pulse Rate:  [71-109] 93 (02/15 2032) Resp:  [15-20] 16 (02/15 1606) BP:  (99-131)/(55-95) 123/72 (02/15 2032) SpO2:  [98 %-100 %] (P) 100 % (02/15 1417) Weight:  [81.2 kg] 81.2 kg (02/15 1142) Physical Examination: CONSTITUTIONAL: Well-developed, well-nourished female in no acute distress.  SKIN: Skin is warm and dry. No rash noted. Not diaphoretic. No erythema. No pallor. NEUROLOGIC: Alert and oriented to person, place, and time. Normal reflexes, muscle tone coordination. No cranial nerve deficit noted. PSYCHIATRIC: Normal mood and affect. Normal behavior. Normal judgment and thought content. CARDIOVASCULAR: Normal heart rate noted, regular rhythm RESPIRATORY: Effort and breath sounds normal, no problems with respiration noted MUSCULOSKELETAL: Normal range of motion. No edema and no tenderness. 2+ distal pulses. ABDOMEN: Soft, nontender, nondistended, gravid. CERVIX: Deferred  Fetal monitoring: FHR for both: 120 bpm, Variability: moderate, Accelerations:Absent Decelerations: Absent  Uterine activity: No contractions  Significant Diagnostic Studies:  Results for orders placed or performed during the hospital encounter of 10/01/21 (from the past 168 hour(s))  Urinalysis, Routine w reflex microscopic Urine, Clean Catch   Collection Time: 10/01/21 12:52 PM  Result Value Ref Range   Color, Urine AMBER (A) YELLOW   APPearance TURBID (A) CLEAR   Specific Gravity, Urine 1.011 1.005 - 1.030   pH 6.0 5.0 - 8.0   Glucose, UA NEGATIVE NEGATIVE mg/dL   Hgb urine dipstick SMALL (A) NEGATIVE   Bilirubin Urine NEGATIVE NEGATIVE   Ketones, ur NEGATIVE NEGATIVE mg/dL   Protein, ur 825 (A) NEGATIVE mg/dL   Nitrite POSITIVE (A) NEGATIVE   Leukocytes,Ua LARGE (A) NEGATIVE   RBC / HPF 11-20 0 - 5 RBC/hpf   WBC, UA >50 (H) 0 - 5 WBC/hpf  Bacteria, UA MANY (A) NONE SEEN   Squamous Epithelial / LPF 11-20 0 - 5   WBC Clumps PRESENT    Mucus PRESENT    Non Squamous Epithelial 0-5 (A) NONE SEEN  Wet prep, genital   Collection Time: 10/01/21  1:19 PM  Result Value Ref  Range   Yeast Wet Prep HPF POC NONE SEEN NONE SEEN   Trich, Wet Prep NONE SEEN NONE SEEN   Clue Cells Wet Prep HPF POC NONE SEEN NONE SEEN   WBC, Wet Prep HPF POC >=10 (A) <10   Sperm NONE SEEN   Amnisure rupture of membrane (rom)not at [facility]   Collection Time: 10/01/21  1:19 PM  Result Value Ref Range   Amnisure ROM POSITIVE   Resp Panel by RT-PCR (Flu A&B, Covid)   Collection Time: 10/01/21  1:20 PM   Specimen: Nasopharyngeal(NP) swabs in vial transport medium  Result Value Ref Range   SARS Coronavirus 2 by RT PCR NEGATIVE NEGATIVE   Influenza A by PCR NEGATIVE NEGATIVE   Influenza B by PCR NEGATIVE NEGATIVE  ABO/Rh   Collection Time: 10/01/21  1:20 PM  Result Value Ref Range   ABO/RH(D)      O POS Performed at [facility], [address], [place], [place] [address]   CBC   Collection Time: 10/01/21  2:00 PM  Result Value Ref Range   WBC 15.1 (H) 4.0 - 10.5 K/uL   RBC 3.11 (L) 3.87 - 5.11 MIL/uL   Hemoglobin 10.4 (L) 12.0 - 15.0 g/dL   HCT 60.4 (L) 54.0 - 98.1 %   MCV 96.1 80.0 - 100.0 fL   MCH 33.4 26.0 - 34.0 pg   MCHC 34.8 30.0 - 36.0 g/dL   RDW 19.1 47.8 - 29.5 %   Platelets 269 150 - 400 K/uL   nRBC 0.0 0.0 - 0.2 %  Type and screen   Collection Time: 10/01/21  2:00 PM  Result Value Ref Range   ABO/RH(D) O POS    Antibody Screen NEG    Sample Expiration      10/04/2021,2359 Performed at [facility], [address], [place], [place] [address]    [place] MFM OB FOLLOW UP  Result Date: 09/15/2021 ----------------------------------------------------------------------  OBSTETRICS REPORT                       (Signed Final 09/15/2021 11:45 am) ---------------------------------------------------------------------- Patient Info  ID #:       [id number]                          D.O.B.:  27-Jan-2001 (20 yrs)  Name:       Chelsea Ellis               Visit Date: 09/15/2021 10:46 am ---------------------------------------------------------------------- Performed By   Attending:        Noralee Space MD        Referred By:      Serita Butcher  Performed By:     Clayton Lefort RDMS       [facility]  [facility]                                                             [facility]                                                             [facility] ---------------------------------------------------------------------- Orders  #  Description                           Code        Ordered By  1  [place] MFM OB FOLLOW UP                   76816.01    RAVI SHANKAR  2  [place] MFM OB FOLLOW UP ADDL              70263.78    RAVI SHANKAR     GEST ----------------------------------------------------------------------  #  Order #                     Accession #                Episode #  1  [id number]                   [id number]                 [id number]  2  [id number]                   [id number]                 [id number] ---------------------------------------------------------------------- Indications  Twin pregnancy, di/di, second trimester        O30.042  Marginal insertion of umbilical cord affecting O43.192  management of mother in second trimester  (Twin B)  Echogenic intracardiac focus of the heart      O35.8XX0  (EIF) Twin B  [redacted] weeks gestation of pregnancy                Z3A.28 ---------------------------------------------------------------------- Fetal Evaluation (Fetus A)  Num Of Fetuses:         2  Fetal Heart Rate(bpm):  144  Cardiac Activity:       Observed  Fetal Lie:              Maternal left side  Presentation:           Cephalic  Placenta:               Posterior  P. Cord Insertion:      Previously Visualized  Membrane Desc:      Dividing Membrane seen -  Dichorionic.  Amniotic Fluid  AFI FV:      Within normal limits                              Largest Pocket(cm)                              7.5  ---------------------------------------------------------------------- Biometry (Fetus A)  BPD:      68.7  mm     G. Age:  27w 4d         26  %    CI:        72.51   %    70 - 86                                                          FL/HC:      20.4   %    18.8 - 20.6  HC:      256.6  mm     G. Age:  27w 6d         17  %    HC/AC:      1.11        1.05 - 1.21  AC:      230.6  mm     G. Age:  27w 3d         25  %    FL/BPD:     76.3   %    71 - 87  FL:       52.4  mm     G. Age:  27w 6d         32  %    FL/AC:      22.7   %    20 - 24  Est. FW:    1105  gm      2 lb 7 oz     25  %     FW Discordancy      0 \ 7 % ---------------------------------------------------------------------- OB History  Gravidity:    1         Term:   0        Prem:   0        SAB:   0  TOP:          0       Ectopic:  0        Living: 0 ---------------------------------------------------------------------- Gestational Age (Fetus A)  LMP:           30w 1d        Date:  02/16/21                 EDD:   11/23/21  U/S Today:     27w 5d                                        EDD:   12/10/21  Best:          28w 0d     Det. By:  U/S Fetus Avg.  EDD:   12/08/21                                      (07/23/21) ---------------------------------------------------------------------- Anatomy (Fetus A)  Cranium:               Appears normal         LVOT:                   Previously seen  Cavum:                 Previously seen        Aortic Arch:            Previously seen  Ventricles:            Appears normal         Ductal Arch:            Appears normal  Choroid Plexus:        Previously seen        Diaphragm:              Previously seen  Cerebellum:            Previously seen        Stomach:                Appears normal, left                                                                        sided  Posterior Fossa:       Previously seen        Abdomen:                Previously seen  Nuchal Fold:           Previously seen         Abdominal Wall:         Previously seen  Face:                  Orbits and profile     Cord Vessels:           Previously seen                         previously seen  Lips:                  Appears normal         Kidneys:                Appear normal  Palate:                Previously seen        Bladder:                Appears normal  Thoracic:              Previously seen        Spine:                  Previously seen  Heart:  Appears normal         Upper Extremities:      Previously seen                         (4CH, axis, and                         situs)  RVOT:                  Previously seen        Lower Extremities:      Previously seen  Other:  Heels and 5th digit prev. visualized. Technically difficult due to fetal          position. ---------------------------------------------------------------------- Fetal Evaluation (Fetus B)  Num Of Fetuses:         2  Fetal Heart Rate(bpm):  136  Cardiac Activity:       Observed  Fetal Lie:              Maternal right side  Presentation:           Breech  Placenta:               Posterior  P. Cord Insertion:      Marginal insertion  Membrane Desc:      Dividing Membrane seen - Dichorionic.  Amniotic Fluid  AFI FV:      Within normal limits                              Largest Pocket(cm)                              7.4 ---------------------------------------------------------------------- Biometry (Fetus B)  BPD:      66.5  mm     G. Age:  26w 6d          9  %    CI:         69.7   %    70 - 86                                                          FL/HC:      19.2   %    18.8 - 20.6  HC:      254.2  mm     G. Age:  27w 4d         12  %    HC/AC:      1.10        1.05 - 1.21  AC:      231.4  mm     G. Age:  27w 3d         27  %    FL/BPD:     73.4   %    71 - 87  FL:       48.8  mm     G. Age:  26w 3d        4.7  %    FL/AC:      21.1   %    20 - 24  LV:  4.4  mm  Est. FW:    1025  gm      2 lb 4 oz     11  %     FW Discordancy         7  %  ---------------------------------------------------------------------- Gestational Age (Fetus B)  LMP:           30w 1d        Date:  02/16/21                 EDD:   11/23/21  U/S Today:     27w 1d                                        EDD:   12/14/21  Best:          Eden Emms 0d     Det. By:  U/S Fetus Avg.           EDD:   12/08/21                                      (07/23/21) ---------------------------------------------------------------------- Anatomy (Fetus B)  Cranium:               Appears normal         Aortic Arch:            Previously seen  Cavum:                 Previously seen        Ductal Arch:            Previously seen  Ventricles:            Appears normal         Diaphragm:              Previously seen  Choroid Plexus:        Previously seen        Stomach:                Appears normal, left                                                                        sided  Cerebellum:            Previously seen        Abdomen:                Previously seen  Posterior Fossa:       Previously seen        Abdominal Wall:         Previously seen  Nuchal Fold:           Previously seen        Cord Vessels:           Previously seen  Face:                  Orbits and profile     Kidneys:  Appear normal                         previously seen  Lips:                  Previously seen        Bladder:                Appears normal  Thoracic:              Previously seen        Spine:                  Previously seen  Heart:                 Appears normal; EIF    Upper Extremities:      Previously seen  RVOT:                  Appears normal         Lower Extremities:      Previously seen  LVOT:                  Previously seen  Other:  Heels and 5th digit prev. visualized.3VV and 3VT normal. ---------------------------------------------------------------------- Cervix Uterus Adnexa  Cervix  Not visualized (advanced GA >24wks)  Right Ovary  Not visualized.  Left Ovary  Not visualized.  ---------------------------------------------------------------------- Impression  Dichorionic-diamniotic twin pregnancy.  Patient returned for fetal growth assessment.  She does not have gestational diabetes.  Blood pressure  today at her office is 138/79 mmHg.  Twin A: Maternal left, cephalic presentation, posterior  placenta.  Amniotic fluid is normal and good fetal activity  seen.  Fetal growth is appropriate for gestational age.  Twin B: Maternal right, breech presentation, posterior  placenta.  Amniotic fluid is normal good fetal activity seen.  Fetal growth is appropriate for gestational age.  The  estimated fetal weight is at the 11th percentile.  Growth discordancy: 7% (normal).  I explained the finding of normal fetal growth assessments  and the potential fetal growth restriction in twin B because of  the estimated fetal weight and the marginal cord insertion. ---------------------------------------------------------------------- Recommendations  -An appointment was made for her to return in 4 weeks for  fetal growth assessment.  -BPP and UA Doppler if fetal growth restriction is seen in  either twin. ----------------------------------------------------------------------                  Noralee Space, MD Electronically Signed Final Report   09/15/2021 11:45 am ----------------------------------------------------------------------  [place] MFM OB FOLLOW UP ADDL GEST  Result Date: 09/15/2021 ----------------------------------------------------------------------  OBSTETRICS REPORT                       (Signed Final 09/15/2021 11:45 am) ---------------------------------------------------------------------- Patient Info  ID #:       [id number]                          D.O.B.:  [birth year] (20 yrs)  Name:       Chelsea Ellis               Visit Date: 09/15/2021 10:46 am ---------------------------------------------------------------------- Performed By  Attending:        Noralee Space MD        Referred By:       Weyman Croon  KRITZER  Performed By:     Clayton Lefort RDMS       [facility]         [facility]                                                             [facility]                                                             [facility] ---------------------------------------------------------------------- Orders  #  Description                           Code        Ordered By  1  [place] MFM OB FOLLOW UP                   76816.01    RAVI SHANKAR  2  [place] MFM OB FOLLOW UP ADDL              [address].60    RAVI SHANKAR     GEST ----------------------------------------------------------------------  #  Order #                     Accession #                Episode #  1  [id number]                   [id number]                 [id number]  2  [id number]                   [id number]                 [id number] ---------------------------------------------------------------------- Indications  Twin pregnancy, di/di, second trimester        O30.042  Marginal insertion of umbilical cord affecting O43.192  management of mother in second trimester  (Twin B)  Echogenic intracardiac focus of the heart      O35.8XX0  (EIF) Twin B  [redacted] weeks gestation of pregnancy                Z3A.28 ---------------------------------------------------------------------- Fetal Evaluation (Fetus A)  Num Of Fetuses:         2  Fetal Heart Rate(bpm):  144  Cardiac Activity:       Observed  Fetal Lie:              Maternal left side  Presentation:           Cephalic  Placenta:  Posterior  P. Cord Insertion:      Previously Visualized  Membrane Desc:      Dividing Membrane seen - Dichorionic.  Amniotic Fluid  AFI FV:      Within normal limits                              Largest Pocket(cm)                              7.5 ---------------------------------------------------------------------- Biometry (Fetus A)  BPD:       68.7  mm     G. Age:  27w 4d         26  %    CI:        72.51   %    70 - 86                                                          FL/HC:      20.4   %    18.8 - 20.6  HC:      256.6  mm     G. Age:  27w 6d         17  %    HC/AC:      1.11        1.05 - 1.21  AC:      230.6  mm     G. Age:  27w 3d         25  %    FL/BPD:     76.3   %    71 - 87  FL:       52.4  mm     G. Age:  27w 6d         32  %    FL/AC:      22.7   %    20 - 24  Est. FW:    1105  gm      2 lb 7 oz     25  %     FW Discordancy      0 \ 7 % ---------------------------------------------------------------------- OB History  Gravidity:    1         Term:   0        Prem:   0        SAB:   0  TOP:          0       Ectopic:  0        Living: 0 ---------------------------------------------------------------------- Gestational Age (Fetus A)  LMP:           30w 1d        Date:  02/16/21                 EDD:   11/23/21  U/S Today:     27w 5d                                        EDD:   12/10/21  Best:  28w 0d     Det. By:  U/S Fetus Avg.           EDD:   12/08/21                                      (07/23/21) ---------------------------------------------------------------------- Anatomy (Fetus A)  Cranium:               Appears normal         LVOT:                   Previously seen  Cavum:                 Previously seen        Aortic Arch:            Previously seen  Ventricles:            Appears normal         Ductal Arch:            Appears normal  Choroid Plexus:        Previously seen        Diaphragm:              Previously seen  Cerebellum:            Previously seen        Stomach:                Appears normal, left                                                                        sided  Posterior Fossa:       Previously seen        Abdomen:                Previously seen  Nuchal Fold:           Previously seen        Abdominal Wall:         Previously seen  Face:                  Orbits and profile     Cord Vessels:            Previously seen                         previously seen  Lips:                  Appears normal         Kidneys:                Appear normal  Palate:                Previously seen        Bladder:                Appears normal  Thoracic:              Previously seen        Spine:  Previously seen  Heart:                 Appears normal         Upper Extremities:      Previously seen                         (4CH, axis, and                         situs)  RVOT:                  Previously seen        Lower Extremities:      Previously seen  Other:  Heels and 5th digit prev. visualized. Technically difficult due to fetal          position. ---------------------------------------------------------------------- Fetal Evaluation (Fetus B)  Num Of Fetuses:         2  Fetal Heart Rate(bpm):  136  Cardiac Activity:       Observed  Fetal Lie:              Maternal right side  Presentation:           Breech  Placenta:               Posterior  P. Cord Insertion:      Marginal insertion  Membrane Desc:      Dividing Membrane seen - Dichorionic.  Amniotic Fluid  AFI FV:      Within normal limits                              Largest Pocket(cm)                              7.4 ---------------------------------------------------------------------- Biometry (Fetus B)  BPD:      66.5  mm     G. Age:  26w 6d          9  %    CI:         69.7   %    70 - 86                                                          FL/HC:      19.2   %    18.8 - 20.6  HC:      254.2  mm     G. Age:  27w 4d         12  %    HC/AC:      1.10        1.05 - 1.21  AC:      231.4  mm     G. Age:  27w 3d         27  %    FL/BPD:     73.4   %    71 - 87  FL:       48.8  mm     G. Age:  26w 3d        4.7  %    FL/AC:  21.1   %    20 - 24  LV:        4.4  mm  Est. FW:    1025  gm      2 lb 4 oz     11  %     FW Discordancy         7  % ---------------------------------------------------------------------- Gestational Age (Fetus B)  LMP:            30w 1d        Date:  02/16/21                 EDD:   11/23/21  U/S Today:     27w 1d                                        EDD:   12/14/21  Best:          Eden Emms 0d     Det. By:  U/S Fetus Avg.           EDD:   12/08/21                                      (07/23/21) ---------------------------------------------------------------------- Anatomy (Fetus B)  Cranium:               Appears normal         Aortic Arch:            Previously seen  Cavum:                 Previously seen        Ductal Arch:            Previously seen  Ventricles:            Appears normal         Diaphragm:              Previously seen  Choroid Plexus:        Previously seen        Stomach:                Appears normal, left                                                                        sided  Cerebellum:            Previously seen        Abdomen:                Previously seen  Posterior Fossa:       Previously seen        Abdominal Wall:         Previously seen  Nuchal Fold:           Previously seen        Cord Vessels:           Previously seen  Face:  Orbits and profile     Kidneys:                Appear normal                         previously seen  Lips:                  Previously seen        Bladder:                Appears normal  Thoracic:              Previously seen        Spine:                  Previously seen  Heart:                 Appears normal; EIF    Upper Extremities:      Previously seen  RVOT:                  Appears normal         Lower Extremities:      Previously seen  LVOT:                  Previously seen  Other:  Heels and 5th digit prev. visualized.3VV and 3VT normal. ---------------------------------------------------------------------- Cervix Uterus Adnexa  Cervix  Not visualized (advanced GA >24wks)  Right Ovary  Not visualized.  Left Ovary  Not visualized. ---------------------------------------------------------------------- Impression  Dichorionic-diamniotic twin pregnancy.   Patient returned for fetal growth assessment.  She does not have gestational diabetes.  Blood pressure  today at her office is 138/79 mmHg.  Twin A: Maternal left, cephalic presentation, posterior  placenta.  Amniotic fluid is normal and good fetal activity  seen.  Fetal growth is appropriate for gestational age.  Twin B: Maternal right, breech presentation, posterior  placenta.  Amniotic fluid is normal good fetal activity seen.  Fetal growth is appropriate for gestational age.  The  estimated fetal weight is at the 11th percentile.  Growth discordancy: 7% (normal).  I explained the finding of normal fetal growth assessments  and the potential fetal growth restriction in twin B because of  the estimated fetal weight and the marginal cord insertion. ---------------------------------------------------------------------- Recommendations  -An appointment was made for her to return in 4 weeks for  fetal growth assessment.  -BPP and UA Doppler if fetal growth restriction is seen in  either twin. ----------------------------------------------------------------------                  Noralee Space, MD Electronically Signed Final Report   09/15/2021 11:45 am ----------------------------------------------------------------------   Future Appointments  Date Time Provider Department Center  10/02/2021  4:00 PM MC-MAU 1 MC-INDC None  10/13/2021  1:00 PM WMC-MFC NURSE [facility]MFC [facility]  10/13/2021  1:15 PM WMC-MFC US2 [facility]MFCUS [facility]  10/14/2021  3:15 PM WMC-BEHAVIORAL HEALTH CLINICIAN [facility]    Discharge Condition: Guarded  Discharge disposition: 07-Left Against Medical Advice/Left Without Being Seen/Elopement        Allergies as of 10/01/2021       Reactions   Elemental Sulfur Hives   Sulfamethoxazole Hives   Pineapple Rash        Medication List     TAKE these medications    amoxicillin 500 MG capsule Commonly known as: AMOXIL Take 1 capsule (500 mg total) by mouth 3 (  three) times daily.    ASPIRIN 81 PO Take by mouth.   docusate sodium 100 MG capsule Commonly known as: COLACE Take 100 mg by mouth daily.   ferrous sulfate 325 (65 FE) MG tablet Commonly known as: FerrouSul Take 1 tablet (325 mg total) by mouth every other day.   prenatal multivitamin Tabs tablet Take 1 tablet by mouth daily at 12 noon.         Total discharge time: 45 minutes   Signed: Jaynie Collins M.D. 10/01/2021, 9:51 PM

## 2021-10-01 NOTE — Consult Note (Signed)
[place]  Prenatal Consult       10/01/2021  2:12 PM   I was asked by Dr. Arther Abbott team to consult on this patient for likely preterm delivery of di/di twins.  I had the pleasure of meeting with Ms. Lehnert and her mother today.  She is a 21 yo G1 at 30+2 weeks with di/di twin girls who presented in labor with ROM and cervical dilation of 5-6cm.  She recently received her first dose of betamethasone.  Father of the babies is peripherally involved, and Ms. Bober is unsure if he will come to the hospital.    I explained that the neonatal intensive care team would be present for the delivery and outlined the likely delivery room course for this baby including routine resuscitation and NRP-guided approaches to the treatment of respiratory distress. We briefly discussed other common problems associated with prematurity including respiratory distress syndrome/CLD, apnea, feeding issues, temperature regulation, and infection risk.  We very briefly discussed IVH/PVL, ROP, and NEC and that these are complications associated with prematurity, but that by 30 weeks are uncommon.    We discussed the average length of stay but I noted that the actual LOS would depend on the severity of problems encountered and response to treatments.  We discussed that the infant's father would not be allowed to consent for medical procedures or decisions unless she puts him on the birth certificate since they are unmarried.   This consult was limited by Ms. Massi's labor discomfort.  We will review this information again and go into more specific details of the care the twin's will require after they are born.     Thank you for involving [place] in the care of this patient. A member of our team will be available should the family have additional questions.  Time for consultation approximately 20 minutes.   _____________________ Electronically Signed By: Towana Badger, MD,  MS Neonatologist

## 2021-10-01 NOTE — Progress Notes (Addendum)
° ° °  Faculty Practice OB/GYN Attending Note   Patient desires to go home. Verbalized risks of leaving AMA.  AMA papers signed, House Coverage RN and MAU staff notified  Patient was instructed to return at any time for any concerns or whenever she desires. Oral latency antibiotics ordered. Also told her to return to MAU tomorrow around 1300 for second betamethasone injection and re-evaluation.  Message will be sent to  [facility] office to arrange for close outpatient follow up.   Jaynie Collins, MD, FACOG Obstetrician & Gynecologist, [facility] for [employer], [facility]

## 2021-10-01 NOTE — MAU Note (Addendum)
Believe monitor pieces were switched after 1st bedside US, A (vertex left)tracing gold, B (mid-upper rt) tracing blue.

## 2021-10-01 NOTE — Progress Notes (Signed)
Patient has been talked to by provider about risks of signing out AMA. Went into room to try to adjust monitors and the patient asked me to step out so she could continue talking to the father of the baby. Will try to go in again in a few minutes.

## 2021-10-01 NOTE — Progress Notes (Signed)
° ° °  Faculty Practice OB/GYN Attending Note  I was told that patient desires to go home, is frustrated with being here.  Went to talk to patient, explained that it is recommended she stay as she is in PTL and has PPROM with 30 week twins, already dilated to 5.5 cm.  Discussed risks of delivery at home without resources to give immediate neonatal intervention for her premature babies at [redacted] weeks gestation; increased risk of infection, fetal death and maternal death. She insisted she wanted to leave. She was told that she will have to sign leaving against medical advice form (AMA) if she leaves, which will say that [facility], providers, nurses and staff are not to be blamed for any adverse outcome due to her decision to leave.  This was all discussed in front of her FOB and mother. They asked to have a private conversation, she will let us know her decision.   For now, will continue fetal monitoring. Continue magnesium sulfate for now and latency antibiotics. Continue close monitoring.   Jaynie Collins, MD, FACOG Obstetrician & Gynecologist, [facility] for [employer], [facility]

## 2021-10-01 NOTE — MAU Note (Signed)
Presents with c/o ctxs and LOF, states symptoms began last night between 1900 and 2000.  Reports LOF is clear/white, currently not wearing sanitary napkin. Denies VB.  Endorses FM. Denies recent intercourse.

## 2021-10-01 NOTE — Progress Notes (Signed)
Continued efforts to trace heart rates of twins but unsuccessful due to gestational age, frequent movements and frequent urination. Communicated with provider about this difficulty and made them aware of the situation. I was told they would come to assist me using the ultrasound to find both of the heart rates.

## 2021-10-01 NOTE — Progress Notes (Signed)
Patient desired to go home and signed out AMA. Attending physician reiterated risks to the patient but patient stood firm in her decision. Papers were signed with me and physician present.   Signed AMA papers at 2127 Fetal monitors removed at 2129 Magnesium stopped at 2133 IV removed at 2134

## 2021-10-01 NOTE — Telephone Encounter (Signed)
Left message stating that we received her message however I do see that you are in MAU currently in which they will address your concerns.  If you have future questions please send a MyChart message or call the office.   Leonette Nutting  10/01/21

## 2021-10-01 NOTE — Telephone Encounter (Signed)
Patient is having severe back and bladder pain also her ankles are very swollen, she is 33w pregnant. She would like a call back from a nurse.

## 2021-10-01 NOTE — H&P (Signed)
OBSTETRIC ADMISSION HISTORY AND PHYSICAL  Chelsea Ellis is a 21 y.o. female G1P0000 with IUP at [redacted]w[redacted]d by 20 week [place] presenting for preterm labor. Patient started having contractions last night around 7-8 PM. She reports that she has also had some clear/white fluid leaking, unsure if her water broke. In the MAU, she was 5-6 cm dilated with bulging bag on exam. Amnisure and vaginal swabs collected, Amnisure positive, wet prep negative. UA consistent with UTI, no CVA tenderness or fevers to suggest pyelonephritis. Patient was transferred to L&D for further management. She was started on a magnesium bolus and betamethasone was given. She denies any headaches, vision changes, RUQ pain, LE edema or dysuria. She reports that her pregnancy has been uncomplicated so far. She plans on breast and bottle feeding. She is planning for Depo for birth control postpartum.   She received her prenatal care at [facility].  Dating: By [place] --->  Estimated Date of Delivery: 12/08/21  Sono:   @[redacted]w[redacted]d , normal anatomy, cephalic/breech presentation, posterior placental lie, A (1105 g), B (1025 g), 25% and 11 % EFW respectively   Prenatal History/Complications:  Dichorionic diamniotic twin gestation  Iron deficiency anemia (Hgb 10.4 on admit) Carrier for SMA  Past Medical History: Past Medical History:  Diagnosis Date   ADHD    Anxiety    Depression    'more irritated with people", not wanting to hurt self or anybody 10/01/21   Frequent UTI    Headache(784.0)    Iron deficiency anemia    Migraine     Past Surgical History: Past Surgical History:  Procedure Laterality Date   NO PAST SURGERIES      Obstetrical History: OB History     Gravida  1   Para  0   Term  0   Preterm  0   AB  0   Living  0      SAB  0   IAB  0   Ectopic  0   Multiple  0   Live Births  0           Social History Social History   Socioeconomic History   Marital status: Single    Spouse name: Not on file    Number of children: Not on file   Years of education: Not on file   Highest education level: Not on file  Occupational History   Not on file  Tobacco Use   Smoking status: Former    Types: Cigarettes    Quit date: 07/15/2021    Years since quitting: 0.2   Smokeless tobacco: Never   Tobacco comments:    Also vape  Vaping Use   Vaping Use: Former   Quit date: 06/26/2021   Substances: THC, CBD, Flavoring  Substance and Sexual Activity   Alcohol use: No   Drug use: No   Sexual activity: Not Currently    Birth control/protection: Condom    Comment: 1st intercourse 21 yo-More than 5 partners  Other Topics Concern   Not on file  Social History Narrative   Gradulated 13/05/2021, working now    Enjoys Investment banker, operational History.   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    Enjoys listening to music, reading, spending time with friends.   Social Determinants of Health   Financial Resource Strain: Not on file  Food Insecurity: No Food Insecurity   Worried About Running Out of Food in the Last Year: Never true   Ran Out of  Food in the Last Year: Never true  Transportation Needs: No Transportation Needs   Lack of Transportation (Medical): No   Lack of Transportation (Non-Medical): No  Physical Activity: Not on file  Stress: Not on file  Social Connections: Not on file    Family History: Family History  Problem Relation Age of Onset   Migraines Mother    Seizures Mother    Diabetes Father    Hypertension Father    Migraines Maternal Aunt    Migraines Maternal Uncle    Cancer Maternal Grandmother        Died at 64-Cervical   Migraines Maternal Grandmother    Migraines Maternal Grandfather     Allergies: Allergies  Allergen Reactions   Elemental Sulfur Hives   Sulfamethoxazole Hives   Pineapple Rash    Medications Prior to Admission  Medication Sig Dispense Refill Last Dose   ASPIRIN 81 PO Take by mouth.   09/30/2021   docusate sodium (COLACE) 100  MG capsule Take 100 mg by mouth daily.   09/30/2021   Prenatal Vit-Fe Fumarate-FA (PRENATAL MULTIVITAMIN) TABS tablet Take 1 tablet by mouth daily at 12 noon.   09/30/2021   ferrous sulfate (FERROUSUL) 325 (65 FE) MG tablet Take 1 tablet (325 mg total) by mouth every other day. 30 tablet 3 09/29/2021    Review of Systems  All systems reviewed and negative except as stated in HPI  Blood pressure 99/75, pulse 79, temperature 98.1 F (36.7 C), resp. rate 16, height 5\' 7"  (1.702 m), weight 81.2 kg, last menstrual period 02/16/2021, SpO2 99 %.  General appearance: alert, cooperative, and no distress Lungs: normal work of breathing on room air  Heart: normal rate, warm and well perfused  Abdomen: soft, non-tender, gravid, no CVA tenderness  Extremities: no LE edema or calf tenderness to palpation   Presentation:  Twin A (cephalic), Twin B (breech) on BSUS Fetal monitoring: Twin A (Baseline 120 bpm, moderate variability, no accels, no decels), Twin B (Baseline 115 bpm, moderate variability, no accels, no decels) - adjusting monitors, confirmed via 04/19/2021 Uterine activity: Every few minutes per patient, assessing tocometer  Dilation: 5.5 Effacement (%): 80 Station: [place], -1 Exam by:: Anyanwu   Prenatal labs: ABO, Rh: --/--/PENDING (02/15 1400) Antibody: PENDING (02/15 1400) Rubella: Immune (11/21 0000) RPR: Non Reactive (01/11 0834)  HBsAg: Negative (11/21 0000)  HIV: Non Reactive (01/11 0834)  GBS:  Unknown  2 hr Glucola normal  Genetic screening - LR NIPS, carrier for SMA Anatomy 01-27-1991 normal   Prenatal Transfer Tool  Maternal Diabetes: No Genetic Screening: LR NIPS, carrier for SMA Maternal Ultrasounds/Referrals: Normal Fetal Ultrasounds or other Referrals:  Referred to Materal Fetal Medicine  Maternal Substance Abuse:  No Significant Maternal Medications:  None Significant Maternal Lab Results: None  Results for orders placed or performed during the hospital encounter of  10/01/21 (from the past 24 hour(s))  Urinalysis, Routine w reflex microscopic Urine, Clean Catch   Collection Time: 10/01/21 12:52 PM  Result Value Ref Range   Color, Urine AMBER (A) YELLOW   APPearance TURBID (A) CLEAR   Specific Gravity, Urine 1.011 1.005 - 1.030   pH 6.0 5.0 - 8.0   Glucose, UA NEGATIVE NEGATIVE mg/dL   Hgb urine dipstick SMALL (A) NEGATIVE   Bilirubin Urine NEGATIVE NEGATIVE   Ketones, ur NEGATIVE NEGATIVE mg/dL   Protein, ur 10/03/21 (A) NEGATIVE mg/dL   Nitrite POSITIVE (A) NEGATIVE   Leukocytes,Ua LARGE (A) NEGATIVE   RBC / HPF 11-20  0 - 5 RBC/hpf   WBC, UA >50 (H) 0 - 5 WBC/hpf   Bacteria, UA MANY (A) NONE SEEN   Squamous Epithelial / LPF 11-20 0 - 5   WBC Clumps PRESENT    Mucus PRESENT    Non Squamous Epithelial 0-5 (A) NONE SEEN  Wet prep, genital   Collection Time: 10/01/21  1:19 PM  Result Value Ref Range   Yeast Wet Prep HPF POC NONE SEEN NONE SEEN   Trich, Wet Prep NONE SEEN NONE SEEN   Clue Cells Wet Prep HPF POC NONE SEEN NONE SEEN   WBC, Wet Prep HPF POC >=10 (A) <10   Sperm NONE SEEN   Amnisure rupture of membrane (rom)not at [facility]   Collection Time: 10/01/21  1:19 PM  Result Value Ref Range   Amnisure ROM POSITIVE   Resp Panel by RT-PCR (Flu A&B, Covid)   Collection Time: 10/01/21  1:20 PM   Specimen: Nasopharyngeal(NP) swabs in vial transport medium  Result Value Ref Range   SARS Coronavirus 2 by RT PCR NEGATIVE NEGATIVE   Influenza A by PCR NEGATIVE NEGATIVE   Influenza B by PCR NEGATIVE NEGATIVE  ABO/Rh   Collection Time: 10/01/21  1:20 PM  Result Value Ref Range   ABO/RH(D)      O POS Performed at [facility], [address] [place], [place] [address]   CBC   Collection Time: 10/01/21  2:00 PM  Result Value Ref Range   WBC 15.1 (H) 4.0 - 10.5 K/uL   RBC 3.11 (L) 3.87 - 5.11 MIL/uL   Hemoglobin 10.4 (L) 12.0 - 15.0 g/dL   HCT 60.429.9 (L) 54.036.0 - 98.146.0 %   MCV 96.1 80.0 - 100.0 fL   MCH 33.4 26.0 - 34.0 pg   MCHC 34.8 30.0 -  36.0 g/dL   RDW 19.112.7 47.811.5 - 29.515.5 %   Platelets 269 150 - 400 K/uL   nRBC 0.0 0.0 - 0.2 %  Type and screen   Collection Time: 10/01/21  2:00 PM  Result Value Ref Range   ABO/RH(D) PENDING    Antibody Screen PENDING    Sample Expiration      10/04/2021,2359 Performed at [facility], [address] [place], [place] [address]     Patient Active Problem List   Diagnosis Date Noted   Preterm labor 10/01/2021   Anemia of pregnancy in third trimester 08/28/2021   Supervision of high risk pregnancy, antepartum 07/25/2021   Abnormal findings on prenatal screening 07/25/2021   Twin pregnancy, twins dichorionic and diamniotic 07/25/2021   ADHD 07/25/2021   Dyshidrotic eczema 09/22/2018   Preventative health care 10/30/2016   IBS (irritable bowel syndrome) 03/05/2016   Anxiety and depression 12/11/2015   Frequent headaches 12/11/2015    Assessment/Plan:  Wayna ChaletCourtney T Sames is a 21 y.o. G1P0000 at 7830w2d here for preterm labor/PPROM.   #Preterm labor: Contracting regularly with advanced cervical dilation. PPROM confirmed via positive Amnisure. NICU consulted in preparation for preterm delivery. Magnesium ordered for neuroprotection and betamethasone given. Presentation vertex for twin A and breech for twin B. Risks and benefits discussed regarding proceeding with vaginal delivery versus cesarean section and patient elects to proceed with vaginal delivery. Will continue to monitor closely.  #Pain: PRN: IV Fentanyl for now  #FWB: Cat 1  #ID:  GBS unknown, Amp and Azithro ordered for PPROM #MOF: Breast/bottle #MOC: Depo  #UTI: No symptoms, UA with nitrites, leukocytes and bacteria upon admission. No CVA tenderness  on exam. No recent fevers. Low concern for pyelonephritis. Dose of CTX given x1.   #Anemia (iron deficiency): Hemoglobin stable at 10.4 on admission.   Plan of care discussed with Drs. Wouk and Anyanwu who agree with management above.   Worthy Rancher, MD  10/01/2021,  3:07 PM

## 2021-10-02 ENCOUNTER — Inpatient Hospital Stay (HOSPITAL_COMMUNITY): Admit: 2021-10-02 | Payer: BC Managed Care – PPO

## 2021-10-02 ENCOUNTER — Telehealth: Payer: Self-pay | Admitting: Student

## 2021-10-02 ENCOUNTER — Telehealth: Payer: Self-pay | Admitting: Family Medicine

## 2021-10-02 LAB — GC/CHLAMYDIA PROBE AMP (~~LOC~~) NOT AT ARMC
Chlamydia: NEGATIVE
Comment: NEGATIVE
Comment: NORMAL
Neisseria Gonorrhea: NEGATIVE

## 2021-10-02 LAB — RPR: RPR Ser Ql: NONREACTIVE

## 2021-10-02 IMAGING — CT CT ABD-PELV W/ CM
2 of 4 series · 16 of 46 positions shown, 18 images · IV contrast (omnipaque)
Comparison: Pelvic ultrasound 05/29/2020

CLINICAL DATA: Nonlocalized abdominal pain

EXAM:
CT ABDOMEN AND PELVIS WITH CONTRAST
TECHNIQUE: Multidetector CT imaging of the abdomen and pelvis was performed
using the standard protocol following bolus administration of
intravenous contrast.
CONTRAST:  60mL OMNIPAQUE IOHEXOL 350 MG/ML SOLN

[Series 2: abd pelvis 5.00 · axial · 0.74mm/px · z∈[-1508,-1088]mm · 13 of 94 slices shown, 15 images]
[im 5/94  soft-tissue]
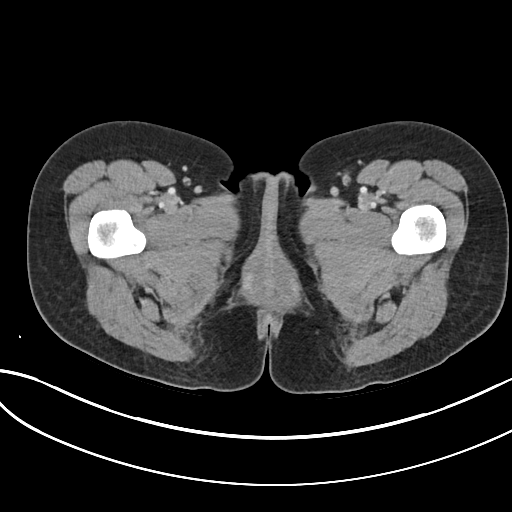
[im 5/94  bone]
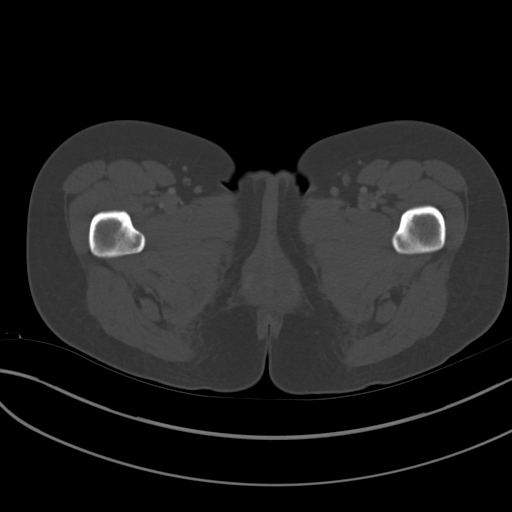
[im 13/94  soft-tissue]
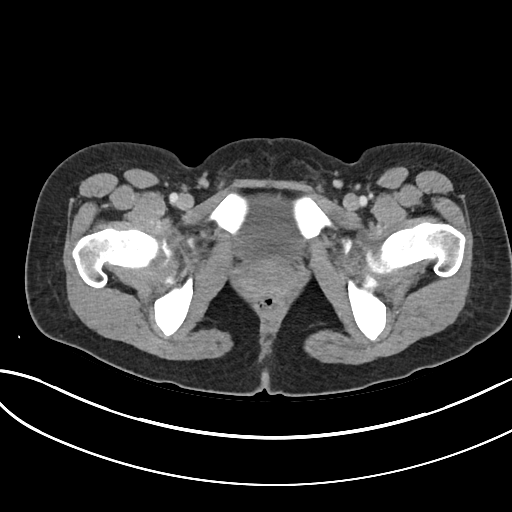
[im 21/94  soft-tissue]
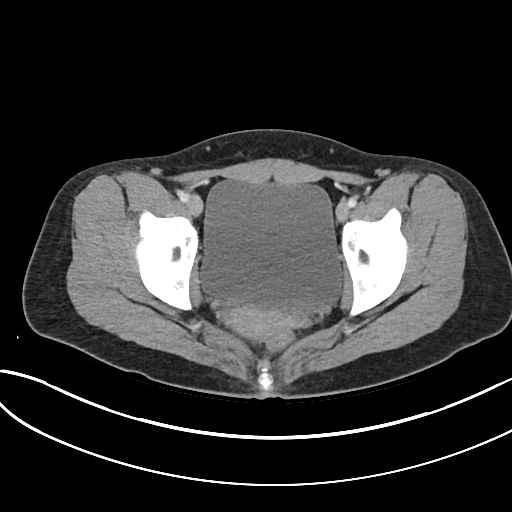
[im 25/94  soft-tissue]
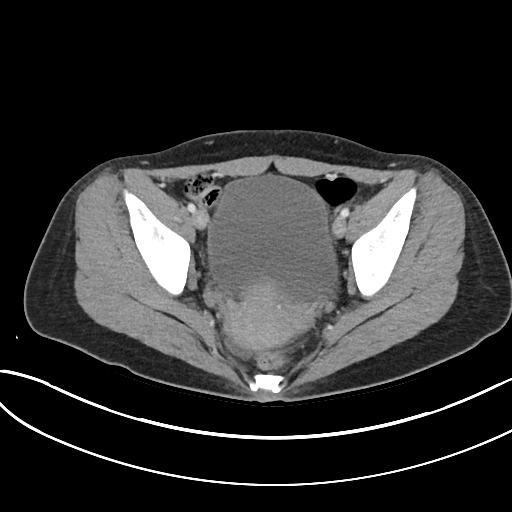
[im 33/94  soft-tissue]
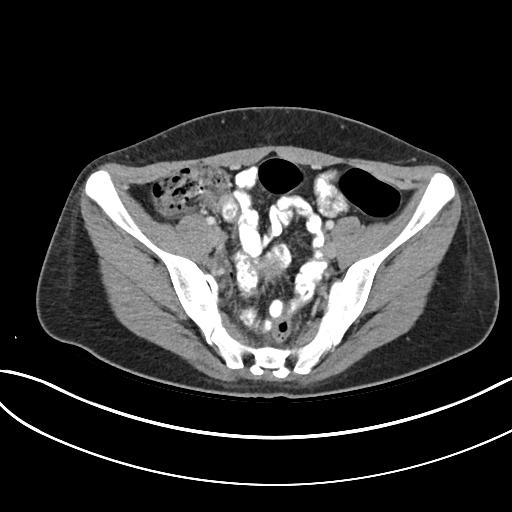
[im 41/94  soft-tissue]
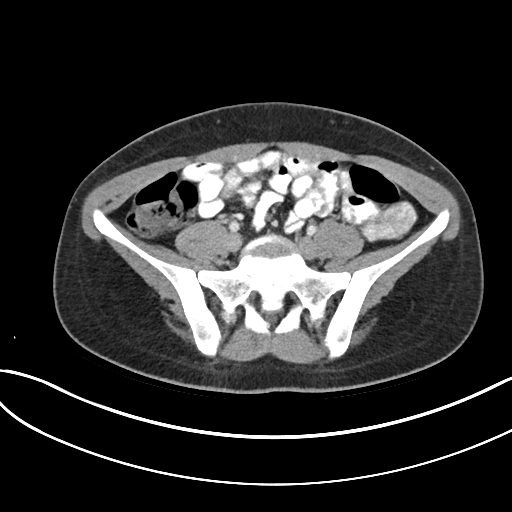
[im 49/94  soft-tissue]
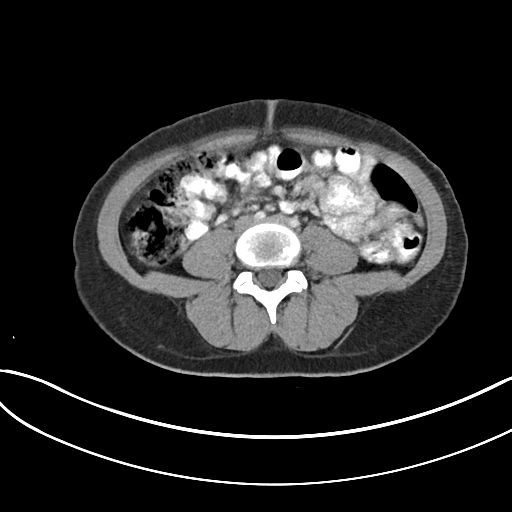
[im 53/94  soft-tissue]
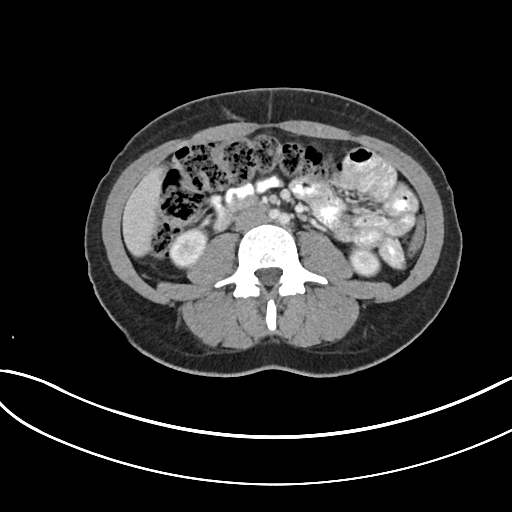
[im 61/94  soft-tissue]
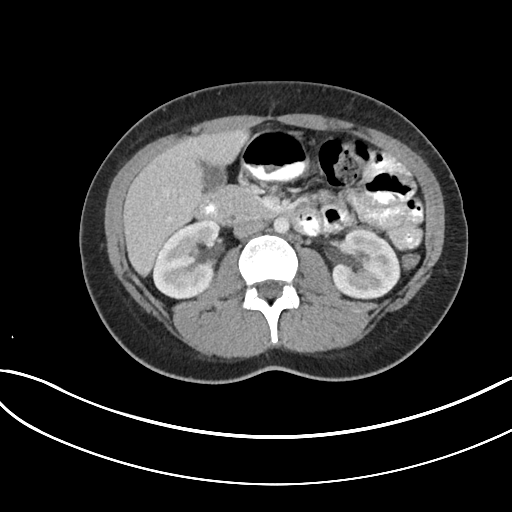
[im 61/94  bone]
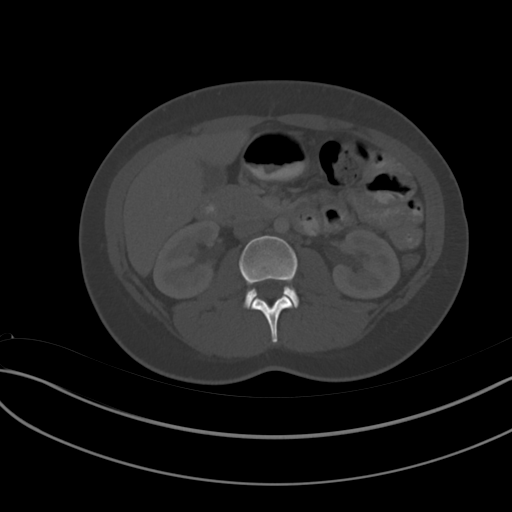
[im 69/94  soft-tissue]
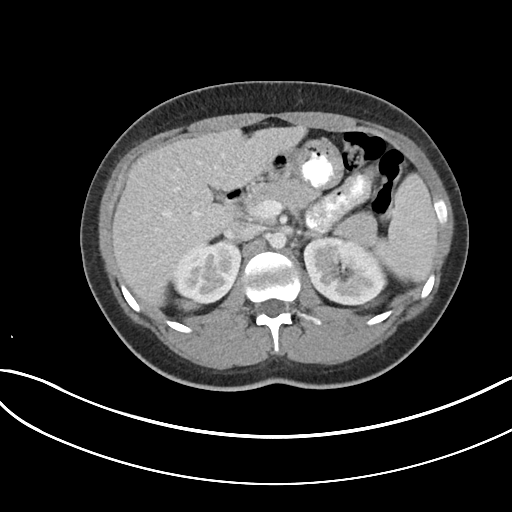
[im 73/94  soft-tissue]
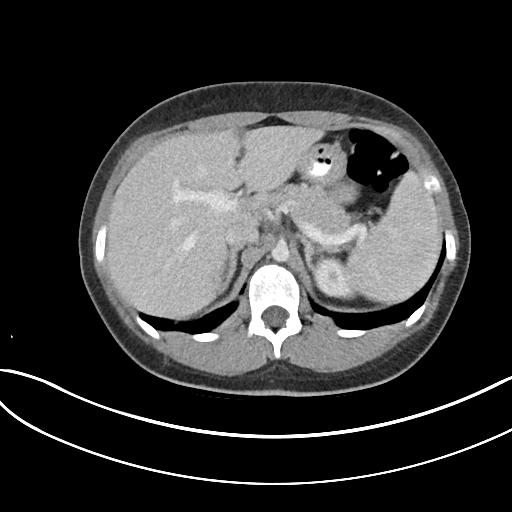
[im 81/94  soft-tissue]
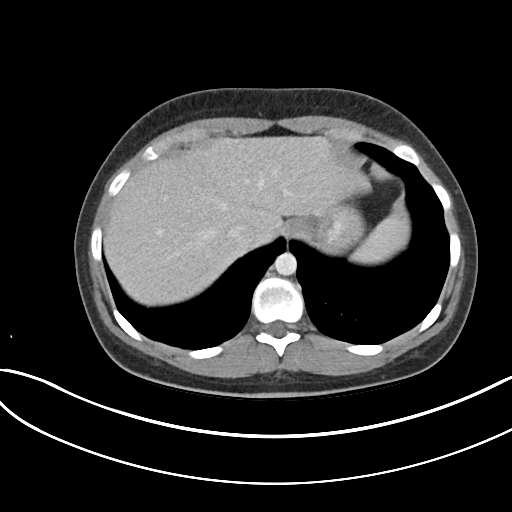
[im 89/94  soft-tissue]
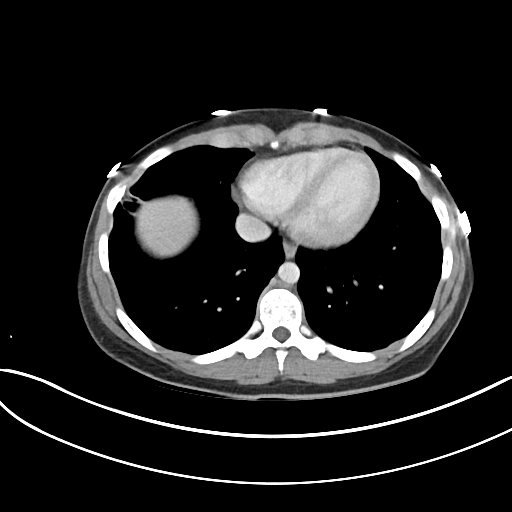

[Series 4: coronals abd pelvis 2.00 cor · coronal · 0.71mm/px · 3 of 121 slices shown]
[im 41/121  soft-tissue]
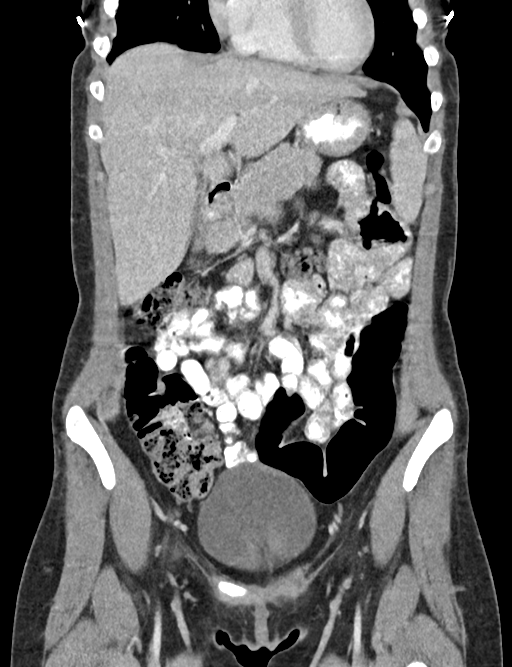
[im 54/121  soft-tissue]
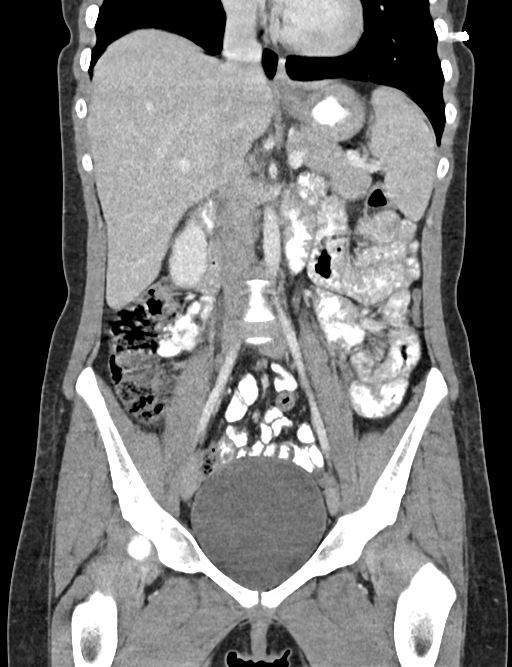
[im 67/121  soft-tissue]
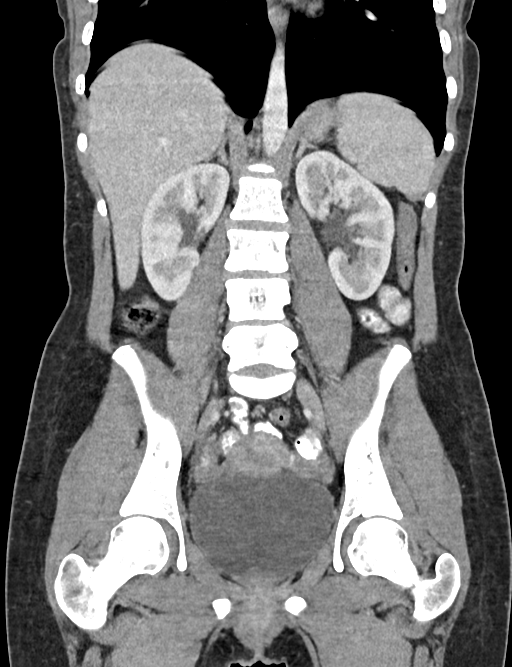

[16 of 46 positions shown; findings below may reference images not displayed]

FINDINGS: Lower chest: Lung bases demonstrate scattered foci of atelectasis.
No consolidation or effusion.

Hepatobiliary: No focal liver abnormality is seen. No gallstones,
gallbladder wall thickening, or biliary dilatation. Probable focal
fat infiltration near the falciform ligament.

Pancreas: Unremarkable. No pancreatic ductal dilatation or
surrounding inflammatory changes.

Spleen: Normal in size without focal abnormality.

Adrenals/Urinary Tract: Adrenal glands are unremarkable. Kidneys are
normal, without renal calculi, focal lesion, or hydronephrosis.
Bladder is unremarkable.

Stomach/Bowel: Stomach is within normal limits. Appendix appears
normal. No evidence of bowel wall thickening, distention, or
inflammatory changes.

Vascular/Lymphatic: No significant vascular findings are present. No
enlarged abdominal or pelvic lymph nodes.

Reproductive: Uterus and bilateral adnexa are unremarkable. Probable
ring-enhancing corpus luteum in the right adnexa.

Other: Trace free fluid.  No free air.

Musculoskeletal: No acute or significant osseous findings.
IMPRESSION: 1. No CT evidence for acute intra-abdominal or pelvic abnormality.
2. Trace free fluid in the pelvis.

## 2021-10-02 NOTE — Telephone Encounter (Signed)
Called patient to inform her of an appointment that was requested to be scheduled for her, there was no answer to the phone call so a voicemail was left with the call back number to the office.

## 2021-10-02 NOTE — Telephone Encounter (Signed)
Telephone call to patient to come back for BMZ injection. Patient states that she has been "in bed all day" and could not come in for her injection. I urged patient to come back asap for injection, and that coming to [facility] does not mean she will be readmitted. She reports she is concerned that she is going to be made to stay in-patient. Encouraged patient to come to MAU for injection, and then she can go home if she wants to. Patient says she will talk to her mother and make a plan to come in.   Chelsea Ellis

## 2021-10-03 LAB — CULTURE, OB URINE
Culture: >=100000 — AB
Special Requests: NORMAL

## 2021-10-03 LAB — CULTURE, BETA STREP (GROUP B ONLY)

## 2021-10-03 NOTE — BH Specialist Note (Unsigned)
Integrated Behavioral Health via Telemedicine Visit  10/03/2021 KIMMARIE PASCALE [id number]  Number of Integrated Behavioral Health Clinician visits: No data recorded Session Start time: No data recorded  Session End time: No data recorded Total time in minutes: No data recorded  Referring Provider: *** Patient/Family location: *** [facility] Provider location: *** All persons participating in visit: *** Types of Service: {CHL AMB TYPE OF SERVICE:[phone]}  I connected with Wayna Chalet and/or Toni Amend T Chrystal's {family members:20773} via  Telephone or Engineer, civil (consulting)  (Video is Surveyor, mining) and verified that I am speaking with the correct person using two identifiers. Discussed confidentiality: {YES/NO:21197}  I discussed the limitations of telemedicine and the availability of in person appointments.  Discussed there is a possibility of technology failure and discussed alternative modes of communication if that failure occurs.  I discussed that engaging in this telemedicine visit, they consent to the provision of behavioral healthcare and the services will be billed under their insurance.  Patient and/or legal guardian expressed understanding and consented to Telemedicine visit: {YES/NO:21197}  Presenting Concerns: Patient and/or family reports the following symptoms/concerns: *** Duration of problem: ***; Severity of problem: {Mild/Moderate/Severe:20260}  Patient and/or Family's Strengths/Protective Factors: {CHL AMB BH PROTECTIVE FACTORS:[phone]}  Goals Addressed: Patient will:  Reduce symptoms of: {IBH Symptoms:21014056}   Increase knowledge and/or ability of: {IBH Patient Tools:21014057}   Demonstrate ability to: {IBH Goals:21014053}  Progress towards Goals: {CHL AMB BH PROGRESS TOWARDS GOALS:[phone]}  Interventions: Interventions utilized:  {IBH Interventions:21014054} Standardized Assessments completed: {IBH Screening  Tools:21014051}  Patient and/or Family Response: ***  Assessment: Patient currently experiencing ***.   Patient may benefit from ***.  Plan: Follow up with behavioral health clinician on : *** Behavioral recommendations: *** Referral(s): {IBH Referrals:21014055}  I discussed the assessment and treatment plan with the patient and/or parent/guardian. They were provided an opportunity to ask questions and all were answered. They agreed with the plan and demonstrated an understanding of the instructions.   They were advised to call back or seek an in-person evaluation if the symptoms worsen or if the condition fails to improve as anticipated.  Valetta Close Jerri Glauser, LCSW

## 2021-10-06 ENCOUNTER — Ambulatory Visit (INDEPENDENT_AMBULATORY_CARE_PROVIDER_SITE_OTHER): Payer: BC Managed Care – PPO | Admitting: Obstetrics and Gynecology

## 2021-10-06 ENCOUNTER — Other Ambulatory Visit: Payer: Self-pay

## 2021-10-06 VITALS — BP 117/75 | HR 82 | Wt 177.6 lb

## 2021-10-06 DIAGNOSIS — O42113 Preterm premature rupture of membranes, onset of labor more than 24 hours following rupture, third trimester: Secondary | ICD-10-CM

## 2021-10-06 DIAGNOSIS — O99013 Anemia complicating pregnancy, third trimester: Secondary | ICD-10-CM

## 2021-10-06 DIAGNOSIS — O0990 Supervision of high risk pregnancy, unspecified, unspecified trimester: Secondary | ICD-10-CM | POA: Diagnosis not present

## 2021-10-06 DIAGNOSIS — O30043 Twin pregnancy, dichorionic/diamniotic, third trimester: Secondary | ICD-10-CM

## 2021-10-06 NOTE — Progress Notes (Signed)
PRENATAL VISIT NOTE  Subjective:  Chelsea Ellis is a 21 y.o. G1P0000 at [redacted]w[redacted]d being seen today for ongoing prenatal care.  She is currently monitored for the following issues for this high-risk pregnancy and has Anxiety and depression; Frequent headaches; IBS (irritable bowel syndrome); Left against medical advice; Dyshidrotic eczema; Supervision of high risk pregnancy, antepartum; Abnormal findings on prenatal screening; Twin pregnancy, twins dichorionic and diamniotic; ADHD; Anemia of pregnancy in third trimester; Preterm labor; Preterm premature rupture of membranes (PPROM) in third trimester, antepartum; and UTI in pregnancy, antepartum, third trimester on their problem list.  Patient reports  very little in the way of leakage of fluid .  Contractions: Irritability. Vag. Bleeding: None.  Movement: Present.   The following portions of the patient's history were reviewed and updated as appropriate: allergies, current medications, past family history, past medical history, past social history, past surgical history and problem list.   Objective:   Vitals:   10/06/21 1443  BP: 117/75  Pulse: 82  Weight: 177 lb 9.6 oz (80.6 kg)    Fetal Status: Fetal Heart Rate (bpm): 170   Movement: Present     General:  Alert, oriented and cooperative. Patient is in no acute distress.  Skin: Skin is warm and dry. No rash noted.   Cardiovascular: Normal heart rate noted  Respiratory: Normal respiratory effort, no problems with respiration noted  Abdomen: Soft, gravid, appropriate for gestational age.  Pain/Pressure: Present     Pelvic: Cervical exam deferred        Extremities: Normal range of motion.     Mental Status: Normal mood and affect. Normal behavior. Normal judgment and thought content.   Assessment and Plan:  Pregnancy: G1P0000 at [redacted]w[redacted]d 1. Supervision of high risk pregnancy, antepartum - For anxiety and depression, referred to [facility] - Ambulatory referral to Integrated Behavioral  Health  2. Preterm premature rupture of membranes (PPROM) in third trimester, antepartum - We discussed her understanding of the risks with PPROM and the risks of outpatient management. She was aware of infection risk especially in the s/o advanced cervical dilation - We discussed the risk of abruption and risk to maternal and fetal life as well as possible morbidity - We discussed the risk of stillbirth - We discussed the risk of precipitous delivery as she is 5 cm dilated. We discussed the risk to neonatal outcomes if she were to deliver at home.  - We discussed NST twice weekly and BPP weekly but that ideally she would return to the hospital for inpatient daily monitoring as well as regular [place]. We discussed the other importance of hospitalization is proximity in the event of precipitous delivery or abruption.  - After this discussion, she wanted some time to consider and discuss with her mom who was present today too. I reviewed that at any point we would be happy to have her come back to the hospital until delivery at 34 weeks (unless needed sooner) - We discussed low threshold to come back to MAU and what s/sx to watch out for.  - She got 1st dose of BMZ on 2/15. She has not had second dose - will offer tomorrow at her NST.  - [place] MFM FETAL BPP W/NONSTRESS ADD'L GEST; Future  3. Dichorionic diamniotic twin pregnancy in third trimester - Has serial growth scans which have been normal - [place] MFM FETAL BPP W/NONSTRESS ADD'L GEST; Future  4. Preterm labor in third trimester without delivery - See above  5. Anemia of pregnancy  in third trimester - She is on PO iron  Preterm labor symptoms and general obstetric precautions including but not limited to vaginal bleeding, contractions, leaking of fluid and fetal movement were reviewed in detail with the patient. Please refer to After Visit Summary for other counseling recommendations.   Return in about 1 week (around 10/13/2021) for OB VISIT, MD  only.  Future Appointments  Date Time Provider Department Center  10/07/2021  9:15 AM [facility] NST [facility]  10/08/2021  3:30 PM WMC-MFC NURSE [facility]  10/08/2021  3:45 PM WMC-MFC US1 [facility]  10/10/2021  9:15 AM WMC-WOCA NST [facility]  10/13/2021  1:00 PM WMC-MFC NURSE [facility]MFC [facility]  10/13/2021  1:15 PM WMC-MFC US2 [facility]  10/13/2021  2:15 PM Warden Fillers, MD [facility]  10/13/2021  3:15 PM WMC-WOCA NST [facility]  10/14/2021  3:15 PM WMC-BEHAVIORAL HEALTH CLINICIAN [facility]  10/16/2021  2:15 PM WMC-WOCA NST [facility]  10/20/2021  2:15 PM WMC-WOCA NST [facility]  10/20/2021  3:55 PM Adam Phenix, MD [facility]  10/23/2021  2:15 PM WMC-WOCA NST [facility]  10/27/2021  2:15 PM WMC-WOCA NST [facility]  10/27/2021  3:55 PM Adam Phenix, MD [facility]  10/30/2021  2:15 PM WMC-WOCA NST [facility]  11/03/2021  2:15 PM WMC-WOCA NST [facility]  11/03/2021  3:55 PM Warden Fillers, MD [facility]  11/06/2021  2:15 PM WMC-WOCA NST [facility]  11/10/2021  2:15 PM WMC-WOCA NST [facility]  11/10/2021  3:55 PM Reva Bores, MD [facility]  11/13/2021  2:15 PM WMC-WOCA NST [facility]  11/17/2021  2:15 PM WMC-WOCA NST [facility]  11/17/2021  3:55 PM Kathlene Cote [facility]  11/20/2021  2:15 PM [facility]-WOCA NST [facility]  11/24/2021  2:15 PM W[facility]-WOCA NST [facility]  11/24/2021  3:55 PM Warden Fillers, MD [facility]  11/27/2021  2:15 PM WMC-WOCA NST [facility]  12/01/2021  2:15 PM WMC-WOCA NST [facility]  12/01/2021  3:55 PM Reva Bores, MD [facility]  12/04/2021  9:15 AM WMC-WOCA NST [facility]  12/08/2021  2:15 PM WMC-WOCA NST [facility]  12/08/2021  3:55 PM Hermina Staggers, MD [facility]  12/11/2021  2:15 PM WMC-WOCA NST [facility]    Milas Hock, MD

## 2021-10-06 NOTE — Progress Notes (Signed)
Informal bedside ultrasound performed to evaluate FHRs, FHRs 116bpm & 160bpm

## 2021-10-07 ENCOUNTER — Ambulatory Visit (INDEPENDENT_AMBULATORY_CARE_PROVIDER_SITE_OTHER): Payer: BC Managed Care – PPO | Admitting: General Practice

## 2021-10-07 DIAGNOSIS — O42113 Preterm premature rupture of membranes, onset of labor more than 24 hours following rupture, third trimester: Secondary | ICD-10-CM

## 2021-10-07 NOTE — Progress Notes (Signed)
Patient presents to office today for NST due to pprom at 30 weeks. NST reactive x2. Patient denies contractions and endorses good fetal movement. Patient will follow up on Friday for NST.

## 2021-10-08 ENCOUNTER — Ambulatory Visit: Payer: BC Managed Care – PPO | Admitting: *Deleted

## 2021-10-08 ENCOUNTER — Other Ambulatory Visit: Payer: Self-pay | Admitting: Obstetrics and Gynecology

## 2021-10-08 ENCOUNTER — Ambulatory Visit: Payer: BC Managed Care – PPO

## 2021-10-08 ENCOUNTER — Ambulatory Visit (HOSPITAL_BASED_OUTPATIENT_CLINIC_OR_DEPARTMENT_OTHER): Payer: BC Managed Care – PPO | Admitting: Obstetrics and Gynecology

## 2021-10-08 ENCOUNTER — Other Ambulatory Visit: Payer: Self-pay

## 2021-10-08 ENCOUNTER — Ambulatory Visit (HOSPITAL_BASED_OUTPATIENT_CLINIC_OR_DEPARTMENT_OTHER): Payer: BC Managed Care – PPO

## 2021-10-08 ENCOUNTER — Telehealth: Payer: Self-pay

## 2021-10-08 ENCOUNTER — Ambulatory Visit: Payer: Self-pay

## 2021-10-08 ENCOUNTER — Encounter: Payer: Self-pay | Admitting: *Deleted

## 2021-10-08 VITALS — BP 127/74 | HR 74

## 2021-10-08 DIAGNOSIS — O42113 Preterm premature rupture of membranes, onset of labor more than 24 hours following rupture, third trimester: Secondary | ICD-10-CM | POA: Diagnosis not present

## 2021-10-08 DIAGNOSIS — O43193 Other malformation of placenta, third trimester: Secondary | ICD-10-CM

## 2021-10-08 DIAGNOSIS — O30043 Twin pregnancy, dichorionic/diamniotic, third trimester: Secondary | ICD-10-CM

## 2021-10-08 DIAGNOSIS — O365932 Maternal care for other known or suspected poor fetal growth, third trimester, fetus 2: Secondary | ICD-10-CM | POA: Diagnosis not present

## 2021-10-08 DIAGNOSIS — O365931 Maternal care for other known or suspected poor fetal growth, third trimester, fetus 1: Secondary | ICD-10-CM | POA: Diagnosis not present

## 2021-10-08 DIAGNOSIS — O479 False labor, unspecified: Secondary | ICD-10-CM | POA: Diagnosis not present

## 2021-10-08 DIAGNOSIS — Z3A31 31 weeks gestation of pregnancy: Secondary | ICD-10-CM

## 2021-10-08 DIAGNOSIS — Z87891 Personal history of nicotine dependence: Secondary | ICD-10-CM | POA: Diagnosis not present

## 2021-10-08 DIAGNOSIS — O0990 Supervision of high risk pregnancy, unspecified, unspecified trimester: Secondary | ICD-10-CM

## 2021-10-08 DIAGNOSIS — O289 Unspecified abnormal findings on antenatal screening of mother: Secondary | ICD-10-CM

## 2021-10-08 DIAGNOSIS — O9902 Anemia complicating childbirth: Secondary | ICD-10-CM | POA: Diagnosis not present

## 2021-10-08 DIAGNOSIS — I10 Essential (primary) hypertension: Secondary | ICD-10-CM | POA: Diagnosis not present

## 2021-10-08 DIAGNOSIS — O42913 Preterm premature rupture of membranes, unspecified as to length of time between rupture and onset of labor, third trimester: Secondary | ICD-10-CM | POA: Diagnosis not present

## 2021-10-08 DIAGNOSIS — O30049 Twin pregnancy, dichorionic/diamniotic, unspecified trimester: Secondary | ICD-10-CM | POA: Diagnosis not present

## 2021-10-08 DIAGNOSIS — Z7982 Long term (current) use of aspirin: Secondary | ICD-10-CM | POA: Diagnosis not present

## 2021-10-08 DIAGNOSIS — D509 Iron deficiency anemia, unspecified: Secondary | ICD-10-CM | POA: Diagnosis not present

## 2021-10-08 NOTE — Progress Notes (Signed)
Maternal-Fetal Medicine   Name: Chelsea Ellis DOB: [birth year] MRN: [id number] Referring Provider: Milas Hock, MD  I had the pleasure of seeing [name]. She is G1 P0 at 31w 2d gestation with dichorionic-diamniotic twin pregnancy. She was accompanied by her mother. On ultrasound performed 3 weeks ago, the estimated fetal weight of twin A was at the 25th percentile and that of twin B was at the 11th percentile. Marginal cord insertion was seen in twin B.  2 days ago, she was admitted to [facility] specialty care after evaluation at the MAU that confirmed preterm labor and possible preterm premature rupture of membranes.  AmniSure was positive and the patient had leakage of amniotic fluid.  The cervix was 5 cm dilated. She received a course of betamethasone.  On the same day she was admitted, she signed out AMA. She reports she takes oral antibiotics. Patient does not have uterine contractions but reports mild leakage of amniotic fluid.  She does not have fever or vaginal bleeding.  Her blood pressure today at her office is 127/74 mmHg. She does not have gestational diabetes.  Ultrasound Dichorionic-diamniotic twin pregnancy.  Twin A: Maternal left, cephalic presentation, posterior placenta.  Amniotic fluid is normal and good fetal activity seen.  Subjectively the amniotic fluid is slower than that seen in twin B sac.  The estimated fetal weight is at the 6th percentile.  The abdominal circumference measurement is at the 6th percentile.  Cephalic presentation and the head is very low and entering into the pelvis. Antenatal testing is reassuring.  Umbilical artery Doppler showed normal forward diastolic flow.  BPP 8/8.  Twin B: Maternal right, breech presentation, posterior placenta.  Amniotic fluid is normal and good fetal activity seen.  Estimated fetal weight is at the 1st percentile.  Abdominal circumference measurement is at the 2nd percentile.  Head  circumference measurement is at -1 SD (normal). Antenatal testing is reassuring.  Umbilical artery Doppler showed normal forward diastolic flow.  BPP 8/8.  I counseled the patient on the following: Twin pregnancy with PPROM and preterm labor I explained the diagnosis of PPROM made by her provider based on her symptoms and positive amnisure test. Patient still has leakage of amniotic fluid. PPROM can be associated with complications including but not limited to intraamniotic and fetal infections, cord prolapse and placental abruption. Inpatient management is recommended till delivery.  I counseled the patient that we will recommend delivery at 34 weeks' gestation I explained the significance of cervical dilation of 5 cm and that it can lead to quick delivery usually at home. A growth-restricted premature infant may require prompt respiratory support that is not available at home. Failure to resuscitate may lead to severe neurological morbidities.  I strongly emphasized the importance of inpatient management and advised her to return to the hospital. Her mother informed that she is in favor of our patient being admitted to the hospital.  I explained that fetal growth restriction is more-likely from placental insufficiency and that weekly ultrasound for BPP and UA Dopplers are recommended.  Recommendations -Advised to get admitted. -Delivery at home is possible if patient is not admitted. I discussed measures with patient's mother including cleaning the baby/babies and keeping them warm before coming to the hospital. -Patient will discuss at home and decide. -We made an appointment for her to return next week for BPP and UA Doppler studies.  Thank you for consultation.  If you have any questions or concerns, please  contact me the [facility].  Consultation including face-to-face (more than 50%) counseling 30 minutes.

## 2021-10-08 NOTE — Telephone Encounter (Signed)
Patient had nurse visit appointment scheduled for today 10/08/21 to receive second dose of BMZ. Patient cancelled this appointment stating she was told by MFM staff that it was too late for her to receive the 2nd dose of BMZ and that she needed to restart the sequence. I called the patient and explained to patient she could still come to her nurse visit appointment today and we could restart her BMZ sequence- giving her the first dose today and then she could receive the second dose tomorrow. Per patient she can no longer make it to the appointment today. I asked the patient if she could come in tomorrow to receive the first dose of BMZ but the patient stated she could not. Patient states she could come in on Monday and Tuesday (February 27th and 28th). Appointments scheduled for patient on February 27th and February 28th to receive BMZ sequence. I called patient back to review appointment date and time but patient did not answer, left patient voicemail requesting she call us back or check mychart.   Alesia Richards, RN 10/08/21

## 2021-10-09 ENCOUNTER — Other Ambulatory Visit: Payer: Self-pay | Admitting: *Deleted

## 2021-10-09 DIAGNOSIS — O365990 Maternal care for other known or suspected poor fetal growth, unspecified trimester, not applicable or unspecified: Secondary | ICD-10-CM

## 2021-10-09 DIAGNOSIS — O30003 Twin pregnancy, unspecified number of placenta and unspecified number of amniotic sacs, third trimester: Secondary | ICD-10-CM

## 2021-10-10 ENCOUNTER — Ambulatory Visit (INDEPENDENT_AMBULATORY_CARE_PROVIDER_SITE_OTHER): Payer: BC Managed Care – PPO | Admitting: *Deleted

## 2021-10-10 ENCOUNTER — Other Ambulatory Visit: Payer: Self-pay

## 2021-10-10 VITALS — BP 121/68 | HR 82 | Wt 175.5 lb

## 2021-10-10 DIAGNOSIS — O42113 Preterm premature rupture of membranes, onset of labor more than 24 hours following rupture, third trimester: Secondary | ICD-10-CM

## 2021-10-10 DIAGNOSIS — O289 Unspecified abnormal findings on antenatal screening of mother: Secondary | ICD-10-CM

## 2021-10-10 DIAGNOSIS — O30049 Twin pregnancy, dichorionic/diamniotic, unspecified trimester: Secondary | ICD-10-CM | POA: Diagnosis not present

## 2021-10-10 DIAGNOSIS — O0990 Supervision of high risk pregnancy, unspecified, unspecified trimester: Secondary | ICD-10-CM | POA: Diagnosis not present

## 2021-10-10 NOTE — Progress Notes (Signed)
Here for nst for twins, nst reviewed by Dr Jolayne Panther prior to discharge [facility]

## 2021-10-11 ENCOUNTER — Inpatient Hospital Stay (HOSPITAL_COMMUNITY)
Admission: AD | Admit: 2021-10-11 | Discharge: 2021-10-12 | DRG: 807 | Disposition: A | Discharge status: Home / Self Care | Payer: BC Managed Care – PPO | Attending: Obstetrics and Gynecology | Admitting: Obstetrics and Gynecology

## 2021-10-11 ENCOUNTER — Encounter (HOSPITAL_COMMUNITY): Payer: Self-pay | Admitting: Obstetrics and Gynecology

## 2021-10-11 DIAGNOSIS — O289 Unspecified abnormal findings on antenatal screening of mother: Secondary | ICD-10-CM | POA: Diagnosis present

## 2021-10-11 DIAGNOSIS — D509 Iron deficiency anemia, unspecified: Secondary | ICD-10-CM | POA: Diagnosis present

## 2021-10-11 DIAGNOSIS — O0990 Supervision of high risk pregnancy, unspecified, unspecified trimester: Secondary | ICD-10-CM

## 2021-10-11 DIAGNOSIS — O9902 Anemia complicating childbirth: Secondary | ICD-10-CM | POA: Diagnosis present

## 2021-10-11 DIAGNOSIS — Z3A31 31 weeks gestation of pregnancy: Secondary | ICD-10-CM

## 2021-10-11 DIAGNOSIS — O42913 Preterm premature rupture of membranes, unspecified as to length of time between rupture and onset of labor, third trimester: Secondary | ICD-10-CM | POA: Diagnosis present

## 2021-10-11 DIAGNOSIS — Z7982 Long term (current) use of aspirin: Secondary | ICD-10-CM | POA: Diagnosis not present

## 2021-10-11 DIAGNOSIS — O30049 Twin pregnancy, dichorionic/diamniotic, unspecified trimester: Secondary | ICD-10-CM | POA: Diagnosis present

## 2021-10-11 DIAGNOSIS — O99019 Anemia complicating pregnancy, unspecified trimester: Secondary | ICD-10-CM | POA: Diagnosis present

## 2021-10-11 DIAGNOSIS — Z87891 Personal history of nicotine dependence: Secondary | ICD-10-CM | POA: Diagnosis not present

## 2021-10-11 DIAGNOSIS — O30043 Twin pregnancy, dichorionic/diamniotic, third trimester: Secondary | ICD-10-CM | POA: Diagnosis present

## 2021-10-11 DIAGNOSIS — O42113 Preterm premature rupture of membranes, onset of labor more than 24 hours following rupture, third trimester: Secondary | ICD-10-CM | POA: Diagnosis present

## 2021-10-11 LAB — RAPID URINE DRUG SCREEN, HOSP PERFORMED
Amphetamines: NOT DETECTED
Barbiturates: NOT DETECTED
Benzodiazepines: POSITIVE — AB
Cocaine: POSITIVE — AB
Opiates: NOT DETECTED
Tetrahydrocannabinol: POSITIVE — AB

## 2021-10-11 LAB — TYPE AND SCREEN
ABO/RH(D): O POS
Antibody Screen: NEGATIVE

## 2021-10-11 LAB — RPR: RPR Ser Ql: NONREACTIVE

## 2021-10-11 LAB — CBC
HCT: 27.3 % — ABNORMAL LOW (ref 36.0–46.0)
Hemoglobin: 9.8 g/dL — ABNORMAL LOW (ref 12.0–15.0)
MCH: 33.7 pg (ref 26.0–34.0)
MCHC: 35.9 g/dL (ref 30.0–36.0)
MCV: 93.8 fL (ref 80.0–100.0)
Platelets: 225 10*3/uL (ref 150–400)
RBC: 2.91 MIL/uL — ABNORMAL LOW (ref 3.87–5.11)
RDW: 12.5 % (ref 11.5–15.5)
WBC: 10.7 10*3/uL — ABNORMAL HIGH (ref 4.0–10.5)
nRBC: 0 % (ref 0.0–0.2)

## 2021-10-11 MED ORDER — ONDANSETRON HCL 4 MG PO TABS
4.0000 mg | ORAL_TABLET | ORAL | Status: DC | PRN
  Administered 2021-10-11: 4 mg via ORAL
  Filled 2021-10-11: qty 1

## 2021-10-11 MED ORDER — FENTANYL CITRATE (PF) 100 MCG/2ML IJ SOLN
INTRAMUSCULAR | Status: AC
  Filled 2021-10-11: qty 2

## 2021-10-11 MED ORDER — SOD CITRATE-CITRIC ACID 500-334 MG/5ML PO SOLN: 30.0000 mL | ORAL | Status: DC | PRN

## 2021-10-11 MED ORDER — LACTATED RINGERS IV SOLN: 500.0000 mL | INTRAVENOUS | Status: DC | PRN

## 2021-10-11 MED ORDER — OXYCODONE-ACETAMINOPHEN 5-325 MG PO TABS: 1.0000 | ORAL_TABLET | ORAL | Status: DC | PRN

## 2021-10-11 MED ORDER — MAGNESIUM HYDROXIDE 400 MG/5ML PO SUSP: 30.0000 mL | ORAL | Status: DC | PRN

## 2021-10-11 MED ORDER — OXYCODONE-ACETAMINOPHEN 5-325 MG PO TABS: 2.0000 | ORAL_TABLET | ORAL | Status: DC | PRN

## 2021-10-11 MED ORDER — OXYTOCIN BOLUS FROM INFUSION: 333.0000 mL | Freq: Once | INTRAVENOUS | Status: AC

## 2021-10-11 MED ORDER — OXYTOCIN-SODIUM CHLORIDE 30-0.9 UT/500ML-% IV SOLN
2.5000 [IU]/h | INTRAVENOUS | Status: DC
  Administered 2021-10-11: 2.5 [IU]/h via INTRAVENOUS

## 2021-10-11 MED ORDER — SIMETHICONE 80 MG PO CHEW
80.0000 mg | CHEWABLE_TABLET | ORAL | Status: DC | PRN
  Administered 2021-10-11: 80 mg via ORAL
  Filled 2021-10-11 (×2): qty 1

## 2021-10-11 MED ORDER — COCONUT OIL OIL: 1.0000 "application " | TOPICAL_OIL | Status: DC | PRN

## 2021-10-11 MED ORDER — PRENATAL MULTIVITAMIN CH
1.0000 | ORAL_TABLET | Freq: Every day | ORAL | Status: DC
  Administered 2021-10-11 – 2021-10-12 (×2): 1 via ORAL
  Filled 2021-10-11 (×2): qty 1

## 2021-10-11 MED ORDER — OXYCODONE HCL 5 MG PO TABS: 5.0000 mg | ORAL_TABLET | ORAL | Status: DC | PRN

## 2021-10-11 MED ORDER — OXYTOCIN BOLUS FROM INFUSION: 333.0000 mL | Freq: Once | INTRAVENOUS | Status: DC

## 2021-10-11 MED ORDER — LACTATED RINGERS IV SOLN: INTRAVENOUS | Status: DC

## 2021-10-11 MED ORDER — OXYTOCIN-SODIUM CHLORIDE 30-0.9 UT/500ML-% IV SOLN: 2.5000 [IU]/h | INTRAVENOUS | Status: DC

## 2021-10-11 MED ORDER — BENZOCAINE-MENTHOL 20-0.5 % EX AERO
1.0000 | INHALATION_SPRAY | CUTANEOUS | Status: DC | PRN
Start: 2021-10-11 — End: 2021-10-12
  Filled 2021-10-11: qty 56

## 2021-10-11 MED ORDER — LIDOCAINE HCL (PF) 1 % IJ SOLN
INTRAMUSCULAR | Status: AC
  Filled 2021-10-11: qty 30

## 2021-10-11 MED ORDER — ACETAMINOPHEN 325 MG PO TABS: 650.0000 mg | ORAL_TABLET | ORAL | Status: DC | PRN

## 2021-10-11 MED ORDER — ONDANSETRON HCL 4 MG/2ML IJ SOLN: 4.0000 mg | INTRAMUSCULAR | Status: DC | PRN

## 2021-10-11 MED ORDER — DIPHENHYDRAMINE HCL 25 MG PO CAPS: 25.0000 mg | ORAL_CAPSULE | Freq: Four times a day (QID) | ORAL | Status: DC | PRN

## 2021-10-11 MED ORDER — MEDROXYPROGESTERONE ACETATE 150 MG/ML IM SUSP
150.0000 mg | INTRAMUSCULAR | Status: AC | PRN
  Administered 2021-10-12: 150 mg via INTRAMUSCULAR
  Filled 2021-10-11: qty 1

## 2021-10-11 MED ORDER — WITCH HAZEL-GLYCERIN EX PADS
1.0000 "application " | MEDICATED_PAD | CUTANEOUS | Status: DC | PRN
  Administered 2021-10-12: 1 via TOPICAL

## 2021-10-11 MED ORDER — IBUPROFEN 600 MG PO TABS
600.0000 mg | ORAL_TABLET | Freq: Four times a day (QID) | ORAL | Status: DC
  Administered 2021-10-11 – 2021-10-12 (×5): 600 mg via ORAL
  Filled 2021-10-11 (×5): qty 1

## 2021-10-11 MED ORDER — FENTANYL CITRATE (PF) 100 MCG/2ML IJ SOLN
100.0000 ug | Freq: Once | INTRAMUSCULAR | Status: AC
  Administered 2021-10-11: 100 ug via INTRAVENOUS

## 2021-10-11 MED ORDER — ONDANSETRON HCL 4 MG/2ML IJ SOLN: 4.0000 mg | Freq: Four times a day (QID) | INTRAMUSCULAR | Status: DC | PRN

## 2021-10-11 MED ORDER — LIDOCAINE HCL (PF) 1 % IJ SOLN: 30.0000 mL | INTRAMUSCULAR | Status: DC | PRN

## 2021-10-11 MED ORDER — OXYTOCIN-SODIUM CHLORIDE 30-0.9 UT/500ML-% IV SOLN
INTRAVENOUS | Status: AC
  Administered 2021-10-11: 333 mL via INTRAVENOUS
  Filled 2021-10-11: qty 500

## 2021-10-11 MED ORDER — PANTOPRAZOLE SODIUM 40 MG PO TBEC
40.0000 mg | DELAYED_RELEASE_TABLET | ORAL | Status: DC
Start: 2021-10-11 — End: 2021-10-12
  Administered 2021-10-11: 40 mg via ORAL
  Filled 2021-10-11 (×2): qty 1

## 2021-10-11 MED ORDER — OXYCODONE-ACETAMINOPHEN 5-325 MG PO TABS
1.0000 | ORAL_TABLET | ORAL | Status: DC | PRN
  Administered 2021-10-11: 1 via ORAL
  Filled 2021-10-11: qty 1

## 2021-10-11 MED ORDER — DIBUCAINE (PERIANAL) 1 % EX OINT
1.0000 "application " | TOPICAL_OINTMENT | CUTANEOUS | Status: DC | PRN
  Administered 2021-10-12: 1 via RECTAL
  Filled 2021-10-11: qty 28

## 2021-10-11 NOTE — Lactation Note (Signed)
This note was copied from a baby's chart.  NICU Lactation Consultation Note  Patient Name: Chelsea Ellis [id number] Date: 10/11/2021 Age:21 years   Subjective Reason for consult: Initial assessment LC visited with mother and FOB in NICU at approximately 4 hours p delivery. Mother has not yet pumped and does not know if pump is set up in her room at this time. Mother was a bit weepy during my visit so education was brief. We reviewed the need to begin pumping soon and HE to maximize efforts. Mother is aware that she can request for [facility] to come to her room today prn if she needs assistance with pumping.  Mother recalls that she has a Willow pump at home to use.  She denies hx of breast surgery/trauma.  Objective Infant data: Mother's Current Feeding Choice: Breast Milk    Maternal data: G1P0102  Vaginal, Spontaneous    Assessment Infant: Feeding Status: NPO   Maternal:  Intervention/Plan Interventions: Education; [employer] brochure; "The NICU and Your Baby" book  Plan: Consult Status: Follow-up  NICU Follow-up type: New admission follow up; Verify onset of copious milk; Verify absence of engorgement  Mother to have [facility] paged for pump set up and assistance today prn Mother to begin pumping on q3 schedule   Gwynne Edinger 10/11/2021, 10:19 AM

## 2021-10-11 NOTE — Discharge Summary (Signed)
Postpartum Discharge Summary     Patient Name: Chelsea Ellis DOB: 02-Nov-2000 MRN: [id number]  Date of admission: 10/11/2021 Delivery date:   Naylea, Wigington [[id number]]  10/11/2021    Lace, Chenevert [[id number]]  10/11/2021  Delivering provider:    Markitta, Ausburn [[id number]]  Celest, Reitz [[id number]]  Mallie Snooks C  Date of discharge: 10/12/2021  Admitting diagnosis: Preterm delivery [O60.10X0] Preterm labor [O60.00] Intrauterine pregnancy: [redacted]w[redacted]d    Secondary diagnosis:  Principal Problem:   Preterm delivery Active Problems:   Supervision of high risk pregnancy, antepartum   Abnormal findings on prenatal screening   Twin pregnancy, twins dichorionic and diamniotic   Iron deficiency anemia during pregnancy   Preterm labor   Preterm premature rupture of membranes (PPROM) in third trimester, antepartum  Additional problems: AMA s/p PPROM, preterm birth, NICU admission x 2    Discharge diagnosis: Preterm Pregnancy Delivered and Anemia                                              Post partum procedures: social work Augmentation: N/A Complications: None  Hospital course: Onset of Labor With Vaginal Delivery      21y.o. yo G1P0000 at 326w5das admitted in Active Labor on 10/11/2021. Patient had an uncomplicated labor course as follows:  Membrane Rupture Time/Date:    CoDarin, Redmann0[[id number]]4:57 AM    CoRiyanna, Crutchley0[[id number]]4:57 AM ,   CoLucia, Harm0[[id number]]10/11/2021    CoRossanna, Spitzley0[[id number]]10/11/2021   Delivery Method:   CoMarlan Palau0[[id number]]Vaginal, Spontaneous    CoCarmella, Kees0[[id number]]Vaginal, Spontaneous  Episiotomy:    CoShuntavia, Yerby0[[id number]]None    CoSona, Nations0[[id number]]None  Lacerations:     CoCarmelle, Bamberg0[[id number]]Periurethral    CoKambrea, Carrasco[[id number]]Periurethral  Patient had an uncomplicated postpartum course.  She is ambulating, tolerating a regular diet, passing flatus, and urinating well.  Patient to receive Depo prior to discharge for contraception.  Patient is discharged home in stable condition on 10/12/21.  Newborn Data: Birth date:   CoKinslie, Hove0[[id number]]10/11/2021    CoBethel, Sirois0[[id number]]10/11/2021  Birth time:   CoSolara, Goodchild0[[id number]]4:46 AM    CoJanell Quiet0[[id number]]4:57 AM  Gender:   CoEliot, Bencivenga0[[id number]]Female    CoSharni, Negron0[[id number]]Female  Living status:   CoMarzella, Miracle0[[id number]]  [id number]    NLGXQJGiMacedonia0[[id number]]Living  Apgars:   CoLexia, Vandevender0[[id number]]8 [address]0[[id number]]7 [place]   CoDeloise, Marchant0[[id number]]9 [address][[id number]]8  Weight:   CoAnaise, Sterbenz0[[id number]]  8366    CoKatlyne, Nishida0[[id number]]1350 g   Magnesium Sulfate received: No BMZ received: Yes x 1 Rhophylac: N/A MMR: N/A T-DaP: Declined Flu: N/A Transfusion: No  Physical exam  Vitals:   10/11/21 1227 10/11/21 1951 10/11/21 2335 10/12/21 0804  BP: (!) 108/49 119/67 110/72 121/65  Pulse: 72 72 62 65  Resp: 18 16 15  18  Temp: 97.8 F (36.6 C) 98.4 F (36.9 C) 98.3 F (36.8 C) 98.3 F (36.8 C)  TempSrc: Oral Oral Oral Oral  SpO2: 100% 100% 100% 100%  Weight:      Height:       General: alert, cooperative, and no distress Lochia: appropriate Uterine Fundus: firm and below umbilicus  DVT Evaluation: no LE edema or calf tenderness to palpation   Labs: Lab Results  Component Value Date   WBC 10.7 (H) 10/11/2021   HGB 9.8 (L) 10/11/2021   HCT 27.3 (L) 10/11/2021   MCV 93.8 10/11/2021   PLT 225 10/11/2021   CMP Latest Ref Rng & Units 06/05/2020  Glucose 70 - 99 mg/dL 59(L)  BUN 6 - 23 mg/dL 12   Creatinine 0.40 - 1.20 mg/dL 0.84  Sodium 135 - 145 mEq/L 140  Potassium 3.5 - 5.1 mEq/L 4.4  Chloride 96 - 112 mEq/L 107  CO2 19 - 32 mEq/L 26  Calcium 8.4 - 10.5 mg/dL 9.4  Total Protein 6.0 - 8.3 g/dL 6.6  Total Bilirubin 0.2 - 1.2 mg/dL 0.4  Alkaline Phos 39 - 117 U/L 60  AST 0 - 37 U/L 14  ALT 0 - 35 U/L 10   Edinburgh Score: No flowsheet data found.   After visit meds:  Allergies as of 10/12/2021       Reactions   Elemental Sulfur Hives   Sulfamethoxazole Hives   Pineapple Rash        Medication List     STOP taking these medications    amoxicillin 500 MG capsule Commonly known as: AMOXIL   ASPIRIN 81 PO   docusate sodium 100 MG capsule Commonly known as: COLACE       TAKE these medications    acetaminophen 500 MG tablet Commonly known as: TYLENOL Take 2 tablets (1,000 mg total) by mouth every 8 (eight) hours as needed (pain).   ferrous sulfate 325 (65 FE) MG tablet Commonly known as: FerrouSul Take 1 tablet (325 mg total) by mouth every other day.   ibuprofen 600 MG tablet Commonly known as: ADVIL Take 1 tablet (600 mg total) by mouth every 6 (six) hours as needed (pain).   prenatal multivitamin Tabs tablet Take 1 tablet by mouth daily at 12 noon.         Discharge home in stable condition Infant Feeding: Bottle and Breast Infant Disposition: NICU Discharge instruction: per After Visit Summary and Postpartum booklet. Activity: Advance as tolerated. Pelvic rest for 6 weeks.  Diet: routine diet Future Appointments: Future Appointments  Date Time Provider [place]  10/13/2021 10:20 AM [facility]WOC[facility] NURSE [facility]  10/13/2021  1:00 PM WMC-MFC NURSE [facility]  10/13/2021  1:15 PM WMC-MFC US2 WMC-MFCUS [facility]  10/13/2021  2:15 PM Griffin Basil, MD [facility]  10/13/2021  3:15 PM WMC-WOCA NST [facility]  10/14/2021 10:20 AM WMC-WOCA NURSE [facility]  10/14/2021  3:15 PM [facility]  10/16/2021  2:15  PM WMC-WOCA NST [facility]  10/20/2021  2:15 PM WMC-WOCA NST [facility]  10/20/2021  3:55 PM Woodroe Mode, MD [facility]  10/23/2021  2:15 PM WMC-WOCA NST [facility]  10/27/2021  2:15 PM WMC[facility]WOCA NST [facility]  10/27/2021  3:55 PM Woodroe Mode, MD [facility]  10/30/2021  2:15 PM WMC-WOCA NST [facility]  11/03/2021  2:15 PM WMC-WOCA NST [facility]  11/03/2021  3:55 PM Griffin Basil, MD [facility]  11/06/2021  2:15 PM WMC-WOCA NST [facility]  11/10/2021  2:15 PM WMC-WOCA NST [facility]  11/10/2021  3:55 PM Donnamae Jude, MD [facility]  11/13/2021  2:15 PM WMC-WOCA NST [facility]  11/17/2021  2:15 PM WMC-WOCA NST [facility]  11/17/2021  3:55 PM Danielle Rankin [facility]  11/20/2021  2:15 PM WMC-WOCA NST [facility]  11/24/2021  2:15 PM WMC-WOCA NST [facility]  11/24/2021  3:55 PM Griffin Basil, MD [facility]  11/27/2021  2:15 PM WMC-WOCA NST [facility]  12/01/2021  2:15 PM WMC-WOCA NST [facility]  12/01/2021  3:55 PM Donnamae Jude, MD [facility]  12/04/2021  9:15 AM WMC-WOCA NST [facility]  12/08/2021  2:15 PM WMC-WOCA NST [facility]  12/08/2021  3:55 PM Chancy Milroy, MD [facility]  12/11/2021  2:15 PM WMC-WOCA NST [facility]   Follow up Visit: Please schedule this patient for a In person postpartum visit in 4 weeks with the following provider: Any provider. Additional Postpartum F/U: Postpartum Depression checkup  High risk pregnancy complicated by:  PPROM, preterm birth, twin gestation Delivery mode:     Darlene, Brozowski [[id number]]  Vaginal, Spontaneous    Leonila, Speranza [[id number]]  Vaginal, Spontaneous  Anticipated Birth Control: Depo given postpartum   10/12/2021 Genia Del, MD

## 2021-10-11 NOTE — MAU Provider Note (Signed)
Chief Complaint:  Contractions and Rupture of Membranes   None     HPI: Chelsea Ellis is a 21 y.o. G1P0000 at [redacted]w[redacted]d with di/di twins who presents to maternity admissions via EMS with painful contractions every 1-2 minutes, leaking clear fluid since 0230 and pelvic pressure.  She was diagnosed with PPROM on 10/01/21 and received BMZ x 1 dose. She left AMA prior to receiving second dose.  She did follow up with prenatal appt x1 and [place] x 1 after PPROM.  She is feeling normal fetal movement x 2.      HPI  Past Medical History: Past Medical History:  Diagnosis Date   ADHD    Anxiety    Depression    'more irritated with people", not wanting to hurt self or anybody 10/01/21   Frequent UTI    Headache(784.0)    Iron deficiency anemia    Migraine     Past obstetric history: OB History  Gravida Para Term Preterm AB Living  1 0 0 0 0 0  SAB IAB Ectopic Multiple Live Births  0 0 0 0 0    # Outcome Date GA Lbr Len/2nd Weight Sex Delivery Anes PTL Lv  1 Current             Past Surgical History: Past Surgical History:  Procedure Laterality Date   NO PAST SURGERIES      Family History: Family History  Problem Relation Age of Onset   Migraines Mother    Seizures Mother    Diabetes Father    Hypertension Father    Migraines Maternal Aunt    Migraines Maternal Uncle    Cancer Maternal Grandmother        Died at 64-Cervical   Migraines Maternal Grandmother    Migraines Maternal Grandfather     Social History: Social History   Tobacco Use   Smoking status: Former    Types: Cigarettes    Quit date: 07/15/2021    Years since quitting: 0.2   Smokeless tobacco: Never   Tobacco comments:    Also vape  Vaping Use   Vaping Use: Former   Quit date: 06/26/2021   Substances: THC, CBD, Flavoring  Substance Use Topics   Alcohol use: No   Drug use: No    Allergies:  Allergies  Allergen Reactions   Elemental Sulfur Hives   Sulfamethoxazole Hives   Pineapple Rash     Meds:  Medications Prior to Admission  Medication Sig Dispense Refill Last Dose   amoxicillin (AMOXIL) 500 MG capsule Take 1 capsule (500 mg total) by mouth 3 (three) times daily. 21 capsule 2 Past Month   ASPIRIN 81 PO Take by mouth.   10/11/2021   docusate sodium (COLACE) 100 MG capsule Take 100 mg by mouth daily.   Past Month   ferrous sulfate (FERROUSUL) 325 (65 FE) MG tablet Take 1 tablet (325 mg total) by mouth every other day. 30 tablet 3 10/11/2021   Prenatal Vit-Fe Fumarate-FA (PRENATAL MULTIVITAMIN) TABS tablet Take 1 tablet by mouth daily at 12 noon.   10/11/2021    ROS:  Review of Systems  Constitutional:  Negative for chills, fatigue and fever.  Eyes:  Negative for visual disturbance.  Respiratory:  Negative for shortness of breath.   Cardiovascular:  Negative for chest pain.  Gastrointestinal:  Positive for abdominal pain. Negative for nausea and vomiting.  Genitourinary:  Positive for pelvic pain and vaginal discharge. Negative for difficulty urinating, dysuria, flank pain, vaginal  bleeding and vaginal pain.  Musculoskeletal:  Positive for back pain.  Neurological:  Negative for dizziness and headaches.  Psychiatric/Behavioral: Negative.      I have reviewed patient's Past Medical Hx, Surgical Hx, Family Hx, Social Hx, medications and allergies.   Physical Exam  No data found. Constitutional: Well-developed, well-nourished female in moderate distress.  Cardiovascular: normal rate Respiratory: normal effort GI: Abd soft, non-tender, gravid appropriate for gestational age.  MS: Extremities nontender, no edema, normal ROM Neurologic: Alert and oriented x 4.  GU: Neg CVAT.  Collection of fluid for slide using gloved finger.   Fetal head visualized on exam at introitus  Dilation: 10 Dilation Complete Date: 10/11/21 Dilation Complete Time: 0427 Effacement (%): 100 Exam by:: Fatima Blank, CNM    Labs: No results found for this or any previous visit  (from the past 24 hour(s)). --/--/O POS (02/15 1400)  Imaging:  [place] MFM FETAL BPP WO NON STRESS  Result Date: 10/08/2021 ----------------------------------------------------------------------  OBSTETRICS REPORT                       (Signed Final 10/08/2021 03:07 pm) ---------------------------------------------------------------------- Patient Info  ID #:       [id number]                          D.O.B.:  Jun 27, 2001 (20 yrs)  Name:       Chelsea Ellis               Visit Date: 10/08/2021 12:47 pm ---------------------------------------------------------------------- Performed By  Attending:        Tama High MD        Referred By:      Bedelia Person  Performed By:     Dorena Dew     [facility]                    BS, [facility]                                 [facility] at                                                             [place] [facility]                                                             [facility] ---------------------------------------------------------------------- Orders  #  Description                           Code  Ordered By  1  [place] MFM OB FOLLOW UP                   [id number]    PAULA DUNCAN  2  [place] MFM OB FOLLOW UP ADDL              [id number]    PAULA DUNCAN     GEST  3  [place] MFM FETAL BPP WO NON               76819.01    PAULA DUNCAN     STRESS  4  [place] MFM FETAL BPP WO NON               76819.01    PAULA DUNCAN     STRESS  5  [place] MFM UA CORD DOPPLER                76820.02    PAULA DUNCAN  6  [place] MFM UA ADDL GEST                   76820.01    PAULA DUNCAN ----------------------------------------------------------------------  #  Order #                     Accession #                Episode #  1  [id number]                   [id number]                 [id number]  2  [id number]                   [id number]                 [id number]  3  [id number]                   [id number]                 [id number]  4   [id number]                   [id number]                 [id number]  5  [id number]                   [id number]                 [id number]  6  [id number]                   [id number]                 [id number] ---------------------------------------------------------------------- Indications  Twin pregnancy, di/di, third trimester         O30.043  [redacted] weeks gestation of pregnancy                Z3A.31  Marginal insertion of umbilical cord affecting O43.193  management of mother in third trimester  (Twin B)  Echogenic intracardiac focus of the heart      O35.8XX0  (EIF) Twin B  Premature rupture of membranes - leaking       O42.90  fluid (2-15) ---------------------------------------------------------------------- Fetal Evaluation (Fetus A)  Num Of Fetuses:         2  Fetal Heart Rate(bpm):  128  Cardiac Activity:  Observed  Fetal Lie:              Maternal left side  Presentation:           Cephalic  Placenta:               Posterior  P. Cord Insertion:      Previously Visualized  Amniotic Fluid  AFI FV:      Within normal limits                              Largest Pocket(cm)                              6.6 ---------------------------------------------------------------------- Biophysical Evaluation (Fetus A)  Amniotic F.V:   Within normal limits       F. Tone:        Observed  F. Movement:    Observed                   Score:          8/8  F. Breathing:   Observed ---------------------------------------------------------------------- Biometry (Fetus A)  BPD:      78.4  mm     G. Age:  31w 3d         45  %    CI:        75.23   %    70 - 86                                                          FL/HC:      19.7   %    19.3 - 21.3  HC:      286.7  mm     G. Age:  31w 4d         20  %    HC/AC:      1.14        0.96 - 1.17  AC:      251.7  mm     G. Age:  29w 3d          6  %    FL/BPD:     71.9   %    71 - 87  FL:       56.4  mm     G. Age:  29w 4d          5  %    FL/AC:      22.4   %    20 - 24  HUM:      49.5   mm     G. Age:  29w 1d        < 5  %  Est. FW:    1459  gm      3 lb 3 oz      6  %     FW Discordancy     0 \ 12 % ---------------------------------------------------------------------- OB History  Gravidity:    1         Term:   0        Prem:   0        SAB:   0  TOP:          0       Ectopic:  0        Living: 0 ---------------------------------------------------------------------- Gestational Age (Fetus A)  LMP:           33w 3d        Date:  02/16/21                 EDD:   11/23/21  U/S Today:     30w 4d                                        EDD:   12/13/21  Best:          31w 2d     Det. By:  U/S Fetus Avg.           EDD:   12/08/21                                      (07/23/21) ---------------------------------------------------------------------- Anatomy (Fetus A)  Cranium:               Appears normal         LVOT:                   Previously seen  Cavum:                 Previously seen        Aortic Arch:            Previously seen  Ventricles:            Appears normal         Ductal Arch:            Appears normal  Choroid Plexus:        Previously seen        Diaphragm:              Previously seen  Cerebellum:            Previously seen        Stomach:                Appears normal, left                                                                        sided  Posterior Fossa:       Previously seen        Abdomen:                Previously seen  Nuchal Fold:           Previously seen        Abdominal Wall:         Previously seen  Face:                  Orbits and profile     Cord Vessels:           Previously seen  previously seen  Lips:                  Appears normal         Kidneys:                Appear normal  Palate:                Previously seen        Bladder:                Appears normal  Thoracic:              Previously seen        Spine:                  Previously seen  Heart:                 Appears normal         Upper Extremities:      Previously seen                          (4CH, axis, and                         situs)  RVOT:                  Previously seen        Lower Extremities:      Previously seen  Other:  Heels and 5th digit prev. visualized. Technically difficult due to fetal          position. ---------------------------------------------------------------------- Doppler - Fetal Vessels (Fetus A)  Umbilical Artery   S/D     %tile      RI    %tile      PI    %tile            ADFV    RDFV   3.76       92    0.73       90    1.31       96               No      No ---------------------------------------------------------------------- Fetal Evaluation (Fetus B)  Num Of Fetuses:         2  Fetal Heart Rate(bpm):  150  Cardiac Activity:       Observed  Fetal Lie:              Maternal right side  Presentation:           Breech  Placenta:               Posterior  P. Cord Insertion:      Marginal insertion  Amniotic Fluid  AFI FV:      Within normal limits                              Largest Pocket(cm)                              6.4 ---------------------------------------------------------------------- Biophysical Evaluation (Fetus B)  Amniotic F.V:   Within normal limits       F. Tone:        Observed  F. Movement:    Observed  Score:          8/8  F. Breathing:   Observed ---------------------------------------------------------------------- Biometry (Fetus B)  BPD:      72.2  mm     G. Age:  29w 0d        1.4  %    CI:        70.34   %    70 - 86                                                          FL/HC:      19.7   %    19.3 - 21.3  HC:      274.5  mm     G. Age:  30w 0d        1.9  %    HC/AC:      1.12        0.96 - 1.17  AC:      244.1  mm     G. Age:  28w 5d        1.5  %    FL/BPD:     75.1   %    71 - 87  FL:       54.2  mm     G. Age:  28w 5d        < 1  %    FL/AC:      22.2   %    20 - 24  HUM:        47  mm     G. Age:  27w 5d        < 5  %  Est. FW:    1289  gm    2 lb 13 oz     1.2  %     FW Discordancy        12  %  ---------------------------------------------------------------------- Gestational Age (Fetus B)  LMP:           33w 3d        Date:  02/16/21                 EDD:   11/23/21  U/S Today:     29w 1d                                        EDD:   12/23/21  Best:          31w 2d     Det. By:  U/S Fetus Avg.           EDD:   12/08/21                                      (07/23/21) ---------------------------------------------------------------------- Anatomy (Fetus B)  Cranium:               Appears normal         Aortic Arch:            Previously seen  Cavum:  Previously seen        Ductal Arch:            Previously seen  Ventricles:            Appears normal         Diaphragm:              Previously seen  Choroid Plexus:        Previously seen        Stomach:                Appears normal, left                                                                        sided  Cerebellum:            Previously seen        Abdomen:                Previously seen  Posterior Fossa:       Previously seen        Abdominal Wall:         Previously seen  Nuchal Fold:           Previously seen        Cord Vessels:           Previously seen  Face:                  Orbits and profile     Kidneys:                Appear normal                         previously seen  Lips:                  Previously seen        Bladder:                Appears normal  Thoracic:              Previously seen        Spine:                  Previously seen  Heart:                 Appears normal; EIF    Upper Extremities:      Previously seen  RVOT:                  Appears normal         Lower Extremities:      Previously seen  LVOT:                  Previously seen  Other:  Heels and 5th digit prev. visualized.3VV and 3VT normal. ---------------------------------------------------------------------- Doppler - Fetal Vessels (Fetus B)  Umbilical Artery   S/D     %tile      RI    %tile      PI    %tile            ADFV    RDFV   3.53  86     0.72       88    1.27       95               No      No ---------------------------------------------------------------------- Impression  Dichorionic-diamniotic twin pregnancy.  Twin A: Maternal left, cephalic presentation, posterior  placenta.  Amniotic fluid is normal and good fetal activity  seen.  Subjectively the amniotic fluid is slower than that seen  in twin B sac.  The estimated fetal weight is at the 6th  percentile.  The abdominal circumference measurement is at  the 6th percentile.  Cephalic presentation and the head is  very low and entering into the pelvis.  Antenatal testing is reassuring.  Umbilical artery Doppler  showed normal forward diastolic flow.  BPP 8/8.  Twin B: Maternal right, breech presentation, posterior  placenta.  Amniotic fluid is normal and good fetal activity  seen.  Estimated fetal weight is at the 1st percentile.  Abdominal circumference measurement is at the 2nd  percentile.  Head circumference measurement is at -1 SD  (normal).  Antenatal testing is reassuring.  Umbilical artery Doppler  showed normal forward diastolic flow.  BPP 8/8.  xxxxxxxxxxxxxxxxxxxxxxxxxxxxxxxxxxxxxxxxxxxxxxxx  Consultation (see EPIC )  I had the pleasure of [facility]aindon [facility]. She is G1 P0 at 31w 2d gestation  with dichorionic-diamniotic twin pregnancy. She was  accompanied by her mother.  On ultrasound performed 3 weeks ago, the estimated fetal  weight of twin A was at the 25th percentile and that of twin B  was at the 11th percentile. Marginal cord insertion was seen  in twin B.  2 days ago, [facility] after  evaluation at the MAU that confirmed preterm labor and  possible preterm premature rupture of membranes.  AmniSure was positive and the patient had leakage of  amniotic fluid.  The cervix was 5 cm dilated. She received a  course of betamethasone.  On the same day she was  admitted, she signed out AMA. She reports she takes  oral  antibiotics.  Patient does not have uterine contractions but reports mild  leakage of amniotic fluid.  She does not have fever or vaginal  bleeding.  Her blood pressure today at her office is 127/74 mmHg.  She does not have gestational diabetes.  I counseled the patient on the following:  Twin pregnancy with PPROM and preterm labor  I explained the diagnosis of PPROM made by her provider  based on her symptoms and positive amnisure test. Patient  still has leakage of amniotic fluid.  PPROM can be associated with complications including but  not limited to intraamniotic and fetal infections, cord prolapse  and placental abruption.  Inpatient management is recommended till delivery.  I counseled the patient that we will recommend delivery at 34  weeks' gestation  I explained the significance of cervical dilation of 5 cm and  that it can lead to quick delivery usually at home. A growth-  restricted premature infant may require prompt respiratory  support that is not available at home. Failure to resuscitate  may lead to severe neurological morbidities.  I strongly emphasized the importance of inpatient  management and advised her to return to the hospital. Her  mother informed that she is in favor of our patient being  admitted to the hospital.  I explained that fetal growth restriction is more-likely  from  placental insufficiency and that weekly ultrasound for BPP  and UA Dopplers are recommended. ---------------------------------------------------------------------- Recommendations  -Advised to get admitted.  -Delivery at home is possible if patient is not admitted. I  discussed measures with patient's mother including cleaning  the baby/babies and keeping them warm before coming to  the hospital.  -Patient will discuss at home and decide.  -We made an appointment for her to return next week for  BPP and UA Doppler studies. ----------------------------------------------------------------------                   Tama High, MD Electronically Signed Final Report   10/08/2021 03:07 pm ----------------------------------------------------------------------  [place] MFM FETAL BPP WO NON STRESS  Result Date: 10/08/2021 ----------------------------------------------------------------------  OBSTETRICS REPORT                       (Signed Final 10/08/2021 03:07 pm) ---------------------------------------------------------------------- Patient Info  ID #:       [id number]                          D.O.B.:  10-12-2000 (20 yrs)  Name:       Chelsea Ellis               Visit Date: 10/08/2021 12:47 pm ---------------------------------------------------------------------- Performed By  Attending:        Tama High MD        Referred By:      Bedelia Person  Performed By:     Dorena Dew     [facility]                    BS, RDMS                                 [facility] at                                                             [place] [facility]                                                             [facility] ---------------------------------------------------------------------- Orders  #  Description                           Code        Ordered By  1  [place] MFM OB FOLLOW UP                   [phone]    PAULA DUNCAN  2  [place] MFM OB FOLLOW UP ADDL              [id number]    PAULA DUNCAN     GEST  3  [place] MFM FETAL BPP WO NON               76819.01    PAULA DUNCAN     STRESS  4  [place] MFM FETAL BPP WO NON               76819.01    PAULA DUNCAN     STRESS  5  [place] MFM UA CORD DOPPLER                76820.02    PAULA DUNCAN  6  [place] MFM UA ADDL GEST                   76820.01    Radene Gunning ----------------------------------------------------------------------  #  Order #                     Accession #                Episode #  1  [id number]                   [id number]                 [id number]  2  [id number]                   [id number]                  [id number]  3  [id number]                   [id number]                 [id number]  4  [id number]                   [id number]                 RX:[id number]  5  [id number]                   [id number]                 RX:[id number]  6  [id number]                   [id number]                 [id number] ---------------------------------------------------------------------- Indications  Twin pregnancy, di/di, third trimester         O30.043  [redacted] weeks gestation of pregnancy                Z3A.31  Marginal insertion of umbilical cord affecting O43.193  management of mother in third trimester  (Twin B)  Echogenic intracardiac focus of the heart      O35.8XX0  (EIF) Twin B  Premature rupture of membranes - leaking       O42.90  fluid (2-15) ---------------------------------------------------------------------- Fetal Evaluation (Fetus A)  Num Of Fetuses:         2  Fetal Heart Rate(bpm):  128  Cardiac Activity:       Observed  Fetal Lie:              Maternal left side  Presentation:  Cephalic  Placenta:               Posterior  P. Cord Insertion:      Previously Visualized  Amniotic Fluid  AFI FV:      Within normal limits                              Largest Pocket(cm)                              6.6 ---------------------------------------------------------------------- Biophysical Evaluation (Fetus A)  Amniotic F.V:   Within normal limits       F. Tone:        Observed  F. Movement:    Observed                   Score:          8/8  F. Breathing:   Observed ---------------------------------------------------------------------- Biometry (Fetus A)  BPD:      78.4  mm     G. Age:  31w 3d         45  %    CI:        75.23   %    70 - 86                                                          FL/HC:      19.7   %    19.3 - 21.3  HC:      286.7  mm     G. Age:  31w 4d         20  %    HC/AC:      1.14        0.96 - 1.17  AC:      251.7  mm     G. Age:  29w 3d          6  %    FL/BPD:     71.9   %    71 - 87  FL:       56.4  mm      G. Age:  29w 4d          5  %    FL/AC:      22.4   %    20 - 24  HUM:      49.5  mm     G. Age:  29w 1d        < 5  %  Est. FW:    1459  gm      3 lb 3 oz      6  %     FW Discordancy     0 \ 12 % ---------------------------------------------------------------------- OB History  Gravidity:    1         Term:   0        Prem:   0        SAB:   0  TOP:          0       Ectopic:  0        Living: 0 ---------------------------------------------------------------------- Gestational Age (  Fetus A)  LMP:           33w 3d        Date:  02/16/21                 EDD:   11/23/21  U/S Today:     30w 4d                                        EDD:   12/13/21  Best:          31w 2d     Det. By:  U/S Fetus Avg.           EDD:   12/08/21                                      (07/23/21) ---------------------------------------------------------------------- Anatomy (Fetus A)  Cranium:               Appears normal         LVOT:                   Previously seen  Cavum:                 Previously seen        Aortic Arch:            Previously seen  Ventricles:            Appears normal         Ductal Arch:            Appears normal  Choroid Plexus:        Previously seen        Diaphragm:              Previously seen  Cerebellum:            Previously seen        Stomach:                Appears normal, left                                                                        sided  Posterior Fossa:       Previously seen        Abdomen:                Previously seen  Nuchal Fold:           Previously seen        Abdominal Wall:         Previously seen  Face:                  Orbits and profile     Cord Vessels:           Previously seen                         previously seen  Lips:  Appears normal         Kidneys:                Appear normal  Palate:                Previously seen        Bladder:                Appears normal  Thoracic:              Previously seen        Spine:                  Previously seen   Heart:                 Appears normal         Upper Extremities:      Previously seen                         (4CH, axis, and                         situs)  RVOT:                  Previously seen        Lower Extremities:      Previously seen  Other:  Heels and 5th digit prev. visualized. Technically difficult due to fetal          position. ---------------------------------------------------------------------- Doppler - Fetal Vessels (Fetus A)  Umbilical Artery   S/D     %tile      RI    %tile      PI    %tile            ADFV    RDFV   3.76       92    0.73       90    1.31       96               No      No ---------------------------------------------------------------------- Fetal Evaluation (Fetus B)  Num Of Fetuses:         2  Fetal Heart Rate(bpm):  150  Cardiac Activity:       Observed  Fetal Lie:              Maternal right side  Presentation:           Breech  Placenta:               Posterior  P. Cord Insertion:      Marginal insertion  Amniotic Fluid  AFI FV:      Within normal limits                              Largest Pocket(cm)                              6.4 ---------------------------------------------------------------------- Biophysical Evaluation (Fetus B)  Amniotic F.V:   Within normal limits       F. Tone:        Observed  F. Movement:    Observed                   Score:  8/8  F. Breathing:   Observed ---------------------------------------------------------------------- Biometry (Fetus B)  BPD:      72.2  mm     G. Age:  29w 0d        1.4  %    CI:        70.34   %    70 - 86                                                          FL/HC:      19.7   %    19.3 - 21.3  HC:      274.5  mm     G. Age:  30w 0d        1.9  %    HC/AC:      1.12        0.96 - 1.17  AC:      244.1  mm     G. Age:  28w 5d        1.5  %    FL/BPD:     75.1   %    71 - 87  FL:       54.2  mm     G. Age:  28w 5d        < 1  %    FL/AC:      22.2   %    20 - 24  HUM:        47  mm     G. Age:  27w 5d        < 5   %  Est. FW:    1289  gm    2 lb 13 oz     1.2  %     FW Discordancy        12  % ---------------------------------------------------------------------- Gestational Age (Fetus B)  LMP:           33w 3d        Date:  02/16/21                 EDD:   11/23/21  U/S Today:     29w 1d                                        EDD:   12/23/21  Best:          31w 2d     Det. By:  U/S Fetus Avg.           EDD:   12/08/21                                      (07/23/21) ---------------------------------------------------------------------- Anatomy (Fetus B)  Cranium:               Appears normal         Aortic Arch:            Previously seen  Cavum:                 Previously seen  Ductal Arch:            Previously seen  Ventricles:            Appears normal         Diaphragm:              Previously seen  Choroid Plexus:        Previously seen        Stomach:                Appears normal, left                                                                        sided  Cerebellum:            Previously seen        Abdomen:                Previously seen  Posterior Fossa:       Previously seen        Abdominal Wall:         Previously seen  Nuchal Fold:           Previously seen        Cord Vessels:           Previously seen  Face:                  Orbits and profile     Kidneys:                Appear normal                         previously seen  Lips:                  Previously seen        Bladder:                Appears normal  Thoracic:              Previously seen        Spine:                  Previously seen  Heart:                 Appears normal; EIF    Upper Extremities:      Previously seen  RVOT:                  Appears normal         Lower Extremities:      Previously seen  LVOT:                  Previously seen  Other:  Heels and 5th digit prev. visualized.3VV and 3VT normal. ---------------------------------------------------------------------- Doppler - Fetal Vessels (Fetus B)  Umbilical Artery   S/D      %tile      RI    %tile      PI    %tile            ADFV    RDFV   3.53       86  0.72       88    1.27       95               No      No ---------------------------------------------------------------------- Impression  Dichorionic-diamniotic twin pregnancy.  Twin A: Maternal left, cephalic presentation, posterior  placenta.  Amniotic fluid is normal and good fetal activity  seen.  Subjectively the amniotic fluid is slower than that seen  in twin B sac.  The estimated fetal weight is at the 6th  percentile.  The abdominal circumference measurement is at  the 6th percentile.  Cephalic presentation and the head is  very low and entering into the pelvis.  Antenatal testing is reassuring.  Umbilical artery Doppler  showed normal forward diastolic flow.  BPP 8/8.  Twin B: Maternal right, breech presentation, posterior  placenta.  Amniotic fluid is normal and good fetal activity  seen.  Estimated fetal weight is at the 1st percentile.  Abdominal circumference measurement is at the 2nd  percentile.  Head circumference measurement is at -1 SD  (normal).  Antenatal testing is reassuring.  Umbilical artery Doppler  showed normal forward diastolic flow.  BPP 8/8.  xxxxxxxxxxxxxxxxxxxxxxxxxxxxxxxxxxxxxxxxxxxxxxxx  Consultation (see EPIC )  I had the pleasure of seeing Ms. Marhefka today at the [facility]. She is G1 P0 at 31w 2d gestation  with dichorionic-diamniotic twin pregnancy. She was  accompanied by her mother.  On ultrasound performed 3 weeks ago, the estimated fetal  weight of twin A was at the 25th percentile and that of twin B  was at the 11th percentile. Marginal cord insertion was seen  in twin B.  2 days ago, she was admitted to [facility] specialty care after  evaluation at the MAU that confirmed preterm labor and  possible preterm premature rupture of membranes.  AmniSure was positive and the patient had leakage of  amniotic fluid.  The cervix was 5 cm dilated. She received a  course of  betamethasone.  On the same day she was  admitted, she signed out [place]. She reports she takes oral  antibiotics.  Patient does not have uterine contractions but reports mild  leakage of amniotic fluid.  She does not have fever or vaginal  bleeding.  Her blood pressure today at her office is 127/74 mmHg.  She does not have gestational diabetes.  I counseled the patient on the following:  Twin pregnancy with PPROM and preterm labor  I explained the diagnosis of PPROM made by her provider  based on her symptoms and positive amnisure test. Patient  still has leakage of amniotic fluid.  PPROM can be associated with complications including but  not limited to intraamniotic and fetal infections, cord prolapse  and placental abruption.  Inpatient management is recommended till delivery.  I counseled the patient that we will recommend delivery at 34  weeks' gestation  I explained the significance of cervical dilation of 5 cm and  that it can lead to quick delivery usually at home. A growth-  restricted premature infant may require prompt respiratory  support that is not available at home. Failure to resuscitate  may lead to severe neurological morbidities.  I strongly emphasized the importance of inpatient  management and advised her to return to the hospital. Her  mother informed that she is in favor of our patient being  admitted to the hospital.  I explained that fetal growth restriction is more-likely from  placental insufficiency and  that weekly ultrasound for BPP  and UA Dopplers are recommended. ---------------------------------------------------------------------- Recommendations  -Advised to get admitted.  -Delivery at home is possible if patient is not admitted. I  discussed measures with patient's mother including cleaning  the baby/babies and keeping them warm before coming to  the hospital.  -Patient will discuss at home and decide.  -We made an appointment for her to return next week for  BPP and UA Doppler  studies. ----------------------------------------------------------------------                  Tama High, MD Electronically Signed Final Report   10/08/2021 03:07 pm ----------------------------------------------------------------------  [place] MFM OB FOLLOW UP  Result Date: 10/08/2021 ----------------------------------------------------------------------  OBSTETRICS REPORT                       (Signed Final 10/08/2021 03:07 pm) ---------------------------------------------------------------------- Patient Info  ID #:       [id number]                          D.O.B.:  2001/06/24 (20 yrs)  Name:       Chelsea Ellis               Visit Date: 10/08/2021 12:47 pm ---------------------------------------------------------------------- Performed By  Attending:        Tama High MD        Referred By:      Bedelia Person  Performed By:     Dorena Dew     [facility]                                                             [place] [facility]                                                             [facility] ---------------------------------------------------------------------- Orders  #  Description                           Code        Ordered By  1  [place] MFM OB FOLLOW UP                   [id number]    PAULA DUNCAN  2  [place] MFM OB FOLLOW  UP ADDL              [id number]    PAULA DUNCAN     GEST  3  [place] MFM FETAL BPP WO NON               76819.01    PAULA DUNCAN     STRESS  4  [place] MFM FETAL BPP WO NON               76819.01    PAULA DUNCAN     STRESS  5  [place] MFM UA CORD DOPPLER                76820.02    PAULA DUNCAN  6  [place] MFM UA ADDL GEST                   76820.01    Radene Gunning ----------------------------------------------------------------------  #  Order #                     Accession #                Episode #  1  [id number]                   [id number]                  [id number]  2  [id number]                   [id number]                 [id number]  3  [id number]                   [id number]                 RX:[id number]  4  [id number]                   [id number]                 RX:[id number]  5  [id number]                   [id number]                 RX:[id number]  6  [id number]                   [id number]                 [id number] ---------------------------------------------------------------------- Indications  Twin pregnancy, di/di, third trimester         O30.043  [redacted] weeks gestation of pregnancy                Z3A.31  Marginal insertion of umbilical cord affecting O43.193  management of mother in third trimester  (Twin B)  Echogenic intracardiac focus of the heart      O35.8XX0  (EIF) Twin B  Premature rupture of membranes - leaking       O42.90  fluid (2-15) ---------------------------------------------------------------------- Fetal Evaluation (Fetus A)  Num Of Fetuses:         2  Fetal Heart Rate(bpm):  128  Cardiac Activity:       Observed  Fetal Lie:              Maternal left side  Presentation:           Cephalic  Placenta:  Posterior  P. Cord Insertion:      Previously Visualized  Amniotic Fluid  AFI FV:      Within normal limits                              Largest Pocket(cm)                              6.6 ---------------------------------------------------------------------- Biophysical Evaluation (Fetus A)  Amniotic F.V:   Within normal limits       F. Tone:        Observed  F. Movement:    Observed                   Score:          8/8  F. Breathing:   Observed ---------------------------------------------------------------------- Biometry (Fetus A)  BPD:      78.4  mm     G. Age:  31w 3d         45  %    CI:        75.23   %    70 - 86                                                          FL/HC:      19.7   %    19.3 - 21.3  HC:      286.7  mm     G. Age:  31w 4d         20  %    HC/AC:      1.14        0.96 - 1.17  AC:      251.7  mm     G.  Age:  29w 3d          6  %    FL/BPD:     71.9   %    71 - 87  FL:       56.4  mm     G. Age:  29w 4d          5  %    FL/AC:      22.4   %    20 - 24  HUM:      49.5  mm     G. Age:  29w 1d        < 5  %  Est. FW:    1459  gm      3 lb 3 oz      6  %     FW Discordancy     0 \ 12 % ---------------------------------------------------------------------- OB History  Gravidity:    1         Term:   0        Prem:   0        SAB:   0  TOP:          0       Ectopic:  0        Living: 0 ---------------------------------------------------------------------- Gestational Age (Fetus A)  LMP:           33w 3d  Date:  02/16/21                 EDD:   11/23/21  U/S Today:     30w 4d                                        EDD:   12/13/21  Best:          31w 2d     Det. By:  U/S Fetus Avg.           EDD:   12/08/21                                      (07/23/21) ---------------------------------------------------------------------- Anatomy (Fetus A)  Cranium:               Appears normal         LVOT:                   Previously seen  Cavum:                 Previously seen        Aortic Arch:            Previously seen  Ventricles:            Appears normal         Ductal Arch:            Appears normal  Choroid Plexus:        Previously seen        Diaphragm:              Previously seen  Cerebellum:            Previously seen        Stomach:                Appears normal, left                                                                        sided  Posterior Fossa:       Previously seen        Abdomen:                Previously seen  Nuchal Fold:           Previously seen        Abdominal Wall:         Previously seen  Face:                  Orbits and profile     Cord Vessels:           Previously seen                         previously seen  Lips:                  Appears normal         Kidneys:  Appear normal  Palate:                Previously seen        Bladder:                Appears normal   Thoracic:              Previously seen        Spine:                  Previously seen  Heart:                 Appears normal         Upper Extremities:      Previously seen                         (4CH, axis, and                         situs)  RVOT:                  Previously seen        Lower Extremities:      Previously seen  Other:  Heels and 5th digit prev. visualized. Technically difficult due to fetal          position. ---------------------------------------------------------------------- Doppler - Fetal Vessels (Fetus A)  Umbilical Artery   S/D     %tile      RI    %tile      PI    %tile            ADFV    RDFV   3.76       92    0.73       90    1.31       96               No      No ---------------------------------------------------------------------- Fetal Evaluation (Fetus B)  Num Of Fetuses:         2  Fetal Heart Rate(bpm):  150  Cardiac Activity:       Observed  Fetal Lie:              Maternal right side  Presentation:           Breech  Placenta:               Posterior  P. Cord Insertion:      Marginal insertion  Amniotic Fluid  AFI FV:      Within normal limits                              Largest Pocket(cm)                              6.4 ---------------------------------------------------------------------- Biophysical Evaluation (Fetus B)  Amniotic F.V:   Within normal limits       F. Tone:        Observed  F. Movement:    Observed                   Score:          8/8  F. Breathing:   Observed ---------------------------------------------------------------------- Biometry (Fetus B)  BPD:      72.2  mm     G. Age:  29w 0d        1.4  %    CI:        70.34   %    70 - 86                                                          FL/HC:      19.7   %    19.3 - 21.3  HC:      274.5  mm     G. Age:  30w 0d        1.9  %    HC/AC:      1.12        0.96 - 1.17  AC:      244.1  mm     G. Age:  28w 5d        1.5  %    FL/BPD:     75.1   %    71 - 87  FL:       54.2  mm     G. Age:  28w 5d        < 1   %    FL/AC:      22.2   %    20 - 24  HUM:        47  mm     G. Age:  27w 5d        < 5  %  Est. FW:    1289  gm    2 lb 13 oz     1.2  %     FW Discordancy        12  % ---------------------------------------------------------------------- Gestational Age (Fetus B)  LMP:           33w 3d        Date:  02/16/21                 EDD:   11/23/21  U/S Today:     29w 1d                                        EDD:   12/23/21  Best:          31w 2d     Det. By:  U/S Fetus Avg.           EDD:   12/08/21                                      (07/23/21) ---------------------------------------------------------------------- Anatomy (Fetus B)  Cranium:               Appears normal         Aortic Arch:            Previously seen  Cavum:                 Previously seen        Ductal Arch:            Previously seen  Ventricles:  Appears normal         Diaphragm:              Previously seen  Choroid Plexus:        Previously seen        Stomach:                Appears normal, left                                                                        sided  Cerebellum:            Previously seen        Abdomen:                Previously seen  Posterior Fossa:       Previously seen        Abdominal Wall:         Previously seen  Nuchal Fold:           Previously seen        Cord Vessels:           Previously seen  Face:                  Orbits and profile     Kidneys:                Appear normal                         previously seen  Lips:                  Previously seen        Bladder:                Appears normal  Thoracic:              Previously seen        Spine:                  Previously seen  Heart:                 Appears normal; EIF    Upper Extremities:      Previously seen  RVOT:                  Appears normal         Lower Extremities:      Previously seen  LVOT:                  Previously seen  Other:  Heels and 5th digit prev. visualized.3VV and 3VT normal.  ---------------------------------------------------------------------- Doppler - Fetal Vessels (Fetus B)  Umbilical Artery   S/D     %tile      RI    %tile      PI    %tile            ADFV    RDFV   3.53       86    0.72       88    1.27       95  No      No ---------------------------------------------------------------------- Impression  Dichorionic-diamniotic twin pregnancy.  Twin A: Maternal left, cephalic presentation, posterior  placenta.  Amniotic fluid is normal and good fetal activity  seen.  Subjectively the amniotic fluid is slower than that seen  in twin B sac.  The estimated fetal weight is at the 6th  percentile.  The abdominal circumference measurement is at  the 6th percentile.  Cephalic presentation and the head is  very low and entering into the pelvis.  Antenatal testing is reassuring.  Umbilical artery Doppler  showed normal forward diastolic flow.  BPP 8/8.  Twin B: Maternal right, breech presentation, posterior  placenta.  Amniotic fluid is normal and good fetal activity  seen.  Estimated fetal weight is at the 1st percentile.  Abdominal circumference measurement is at the 2nd  percentile.  Head circumference measurement is at -1 SD  (normal).  Antenatal testing is reassuring.  Umbilical artery Doppler  showed normal forward diastolic flow.  BPP 8/8.  xxxxxxxxxxxxxxxxxxxxxxxxxxxxxxxxxxxxxxxxxxxxxxxx  Consultation (see EPIC )  I had the pleasure of seeing Ms. Guinyard today at the [facility]. She is G1 P0 at 31w 2d gestation  with dichorionic-diamniotic twin pregnancy. She was  accompanied by her mother.  On ultrasound performed 3 weeks ago, the estimated fetal  weight of twin A was at the 25th percentile and that of twin B  was at the 11th percentile. Marginal cord insertion was seen  in twin B.  2 days ago, she was admitted to [facility] specialty care after  evaluation at the MAU that confirmed preterm labor and  possible preterm premature rupture of membranes.   AmniSure was positive and the patient had leakage of  amniotic fluid.  The cervix was 5 cm dilated. She received a  course of betamethasone.  On the same day she was  admitted, she signed out [place]. She reports she takes oral  antibiotics.  Patient does not have uterine contractions but reports mild  leakage of amniotic fluid.  She does not have fever or vaginal  bleeding.  Her blood pressure today at her office is 127/74 mmHg.  She does not have gestational diabetes.  I counseled the patient on the following:  Twin pregnancy with PPROM and preterm labor  I explained the diagnosis of PPROM made by her provider  based on her symptoms and positive amnisure test. Patient  still has leakage of amniotic fluid.  PPROM can be associated with complications including but  not limited to intraamniotic and fetal infections, cord prolapse  and placental abruption.  Inpatient management is recommended till delivery.  I counseled the patient that we will recommend delivery at 34  weeks' gestation  I explained the significance of cervical dilation of 5 cm and  that it can lead to quick delivery usually at home. A growth-  restricted premature infant may require prompt respiratory  support that is not available at home. Failure to resuscitate  may lead to severe neurological morbidities.  I strongly emphasized the importance of inpatient  management and advised her to return to the hospital. Her  mother informed that she is in favor of our patient being  admitted to the hospital.  I explained that fetal growth restriction is more-likely from  placental insufficiency and that weekly ultrasound for BPP  and UA Dopplers are recommended. ---------------------------------------------------------------------- Recommendations  -Advised to get admitted.  -Delivery at home is possible if patient is not admitted. I  discussed measures  with patient's mother including cleaning  the baby/babies and keeping them warm before coming to  the  hospital.  -Patient will discuss at home and decide.  -We made an appointment for her to return next week for  BPP and UA Doppler studies. ----------------------------------------------------------------------                  Tama High, MD Electronically Signed Final Report   10/08/2021 03:07 pm ----------------------------------------------------------------------  [place] MFM OB FOLLOW UP  Result Date: 09/15/2021 ----------------------------------------------------------------------  OBSTETRICS REPORT                       (Signed Final 09/15/2021 11:45 am) ---------------------------------------------------------------------- Patient Info  ID #:       [id number]                          D.O.B.:  [birth year] (20 yrs)  Name:       Chelsea Ellis               Visit Date: 09/15/2021 10:46 am ---------------------------------------------------------------------- Performed By  Attending:        Tama High MD        Referred By:      Bedelia Person  Performed By:     Lelan Pons RDMS       [facility]                                                             [place] [facility]                                                             Women ---------------------------------------------------------------------- Orders  #  Description                           Code        Ordered By  1  [place] MFM OB FOLLOW UP                   76816.01    RAVI SHANKAR  2  [place] MFM OB FOLLOW UP ADDL              [id number]    [place]  GEST ----------------------------------------------------------------------  #  Order #                     Accession #                Episode #  1  [id number]                   [id number]                 [id number]  2  [id number]                   [id number]                 [id number]  ---------------------------------------------------------------------- Indications  Twin pregnancy, di/di, second trimester        O30.042  Marginal insertion of umbilical cord affecting O43.192  management of mother in second trimester  (Twin B)  Echogenic intracardiac focus of the heart      O35.8XX0  (EIF) Twin B  [redacted] weeks gestation of pregnancy                Z3A.28 ---------------------------------------------------------------------- Fetal Evaluation (Fetus A)  Num Of Fetuses:         2  Fetal Heart Rate(bpm):  144  Cardiac Activity:       Observed  Fetal Lie:              Maternal left side  Presentation:           Cephalic  Placenta:               Posterior  P. Cord Insertion:      Previously Visualized  Membrane Desc:      Dividing Membrane seen - Dichorionic.  Amniotic Fluid  AFI FV:      Within normal limits                              Largest Pocket(cm)                              7.5 ---------------------------------------------------------------------- Biometry (Fetus A)  BPD:      68.7  mm     G. Age:  27w 4d         26  %    CI:        72.51   %    70 - 86                                                          FL/HC:      20.4   %    18.8 - 20.6  HC:      256.6  mm     G. Age:  27w 6d         17  %    HC/AC:      1.11        1.05 - 1.21  AC:      230.6  mm     G. Age:  27w 3d         25  %    FL/BPD:     76.3   %  71 - 87  FL:       52.4  mm     G. Age:  27w 6d         32  %    FL/AC:      22.7   %    20 - 24  Est. FW:    1105  gm      2 lb 7 oz     25  %     FW Discordancy      0 \ 7 % ---------------------------------------------------------------------- OB History  Gravidity:    1         Term:   0        Prem:   0        SAB:   0  TOP:          0       Ectopic:  0        Living: 0 ---------------------------------------------------------------------- Gestational Age (Fetus A)  LMP:           30w 1d        Date:  02/16/21                 EDD:   11/23/21  U/S Today:     27w 5d                                         EDD:   12/10/21  Best:          28w 0d     Det. By:  U/S Fetus Avg.           EDD:   12/08/21                                      (07/23/21) ---------------------------------------------------------------------- Anatomy (Fetus A)  Cranium:               Appears normal         LVOT:                   Previously seen  Cavum:                 Previously seen        Aortic Arch:            Previously seen  Ventricles:            Appears normal         Ductal Arch:            Appears normal  Choroid Plexus:        Previously seen        Diaphragm:              Previously seen  Cerebellum:            Previously seen        Stomach:                Appears normal, left  sided  Posterior Fossa:       Previously seen        Abdomen:                Previously seen  Nuchal Fold:           Previously seen        Abdominal Wall:         Previously seen  Face:                  Orbits and profile     Cord Vessels:           Previously seen                         previously seen  Lips:                  Appears normal         Kidneys:                Appear normal  Palate:                Previously seen        Bladder:                Appears normal  Thoracic:              Previously seen        Spine:                  Previously seen  Heart:                 Appears normal         Upper Extremities:      Previously seen                         (4CH, axis, and                         situs)  RVOT:                  Previously seen        Lower Extremities:      Previously seen  Other:  Heels and 5th digit prev. visualized. Technically difficult due to fetal          position. ---------------------------------------------------------------------- Fetal Evaluation (Fetus B)  Num Of Fetuses:         2  Fetal Heart Rate(bpm):  136  Cardiac Activity:       Observed  Fetal Lie:              Maternal right side  Presentation:           Breech   Placenta:               Posterior  P. Cord Insertion:      Marginal insertion  Membrane Desc:      Dividing Membrane seen - Dichorionic.  Amniotic Fluid  AFI FV:      Within normal limits                              Largest Pocket(cm)  7.4 ---------------------------------------------------------------------- Biometry (Fetus B)  BPD:      66.5  mm     G. Age:  26w 6d          9  %    CI:         69.7   %    70 - 86                                                          FL/HC:      19.2   %    18.8 - 20.6  HC:      254.2  mm     G. Age:  27w 4d         12  %    HC/AC:      1.10        1.05 - 1.21  AC:      231.4  mm     G. Age:  27w 3d         58  %    FL/BPD:     73.4   %    71 - 87  FL:       48.8  mm     G. Age:  26w 3d        4.7  %    FL/AC:      21.1   %    20 - 24  LV:        4.4  mm  Est. FW:    1025  gm      2 lb 4 oz     11  %     FW Discordancy         7  % ---------------------------------------------------------------------- Gestational Age (Fetus B)  LMP:           30w 1d        Date:  02/16/21                 EDD:   11/23/21  U/S Today:     27w 1d                                        EDD:   12/14/21  Best:          28w 0d     Det. By:  U/S Fetus Avg.           EDD:   12/08/21                                      (07/23/21) ---------------------------------------------------------------------- Anatomy (Fetus B)  Cranium:               Appears normal         Aortic Arch:            Previously seen  Cavum:                 Previously seen        Ductal Arch:            Previously seen  Ventricles:  Appears normal         Diaphragm:              Previously seen  Choroid Plexus:        Previously seen        Stomach:                Appears normal, left                                                                        sided  Cerebellum:            Previously seen        Abdomen:                Previously seen  Posterior Fossa:       Previously seen         Abdominal Wall:         Previously seen  Nuchal Fold:           Previously seen        Cord Vessels:           Previously seen  Face:                  Orbits and profile     Kidneys:                Appear normal                         previously seen  Lips:                  Previously seen        Bladder:                Appears normal  Thoracic:              Previously seen        Spine:                  Previously seen  Heart:                 Appears normal; EIF    Upper Extremities:      Previously seen  RVOT:                  Appears normal         Lower Extremities:      Previously seen  LVOT:                  Previously seen  Other:  Heels and 5th digit prev. visualized.3VV and 3VT normal. ---------------------------------------------------------------------- Cervix Uterus Adnexa  Cervix  Not visualized (advanced GA >24wks)  Right Ovary  Not visualized.  Left Ovary  Not visualized. ---------------------------------------------------------------------- Impression  Dichorionic-diamniotic twin pregnancy.  Patient returned for fetal growth assessment.  She does not have gestational diabetes.  Blood pressure  today at her office is 138/79 mmHg.  Twin A: Maternal left, cephalic presentation, posterior  placenta.  Amniotic fluid is normal and good fetal activity  seen.  Fetal growth is appropriate for gestational age.  Twin B: Maternal right, breech presentation, posterior  placenta.  Amniotic fluid is normal good fetal activity seen.  Fetal growth is appropriate for gestational age.  The  estimated fetal weight is at the 11th percentile.  Growth discordancy: 7% (normal).  I explained the finding of normal fetal growth assessments  and the potential fetal growth restriction in twin B because of  the estimated fetal weight and the marginal cord insertion. ---------------------------------------------------------------------- Recommendations  -An appointment was made for her to return in 4 weeks for  fetal growth  assessment.  -BPP and UA Doppler if fetal growth restriction is seen in  either twin. ----------------------------------------------------------------------                  Tama High, MD Electronically Signed Final Report   09/15/2021 11:45 am ----------------------------------------------------------------------  [place] MFM OB FOLLOW UP ADDL GEST  Result Date: 10/08/2021 ----------------------------------------------------------------------  OBSTETRICS REPORT                       (Signed Final 10/08/2021 03:07 pm) ---------------------------------------------------------------------- Patient Info  ID #:       [id number]                          D.O.B.:  February 16, 2001 (20 yrs)  Name:       Chelsea Ellis               Visit Date: 10/08/2021 12:47 pm ---------------------------------------------------------------------- Performed By  Attending:        Tama High MD        Referred By:      Bedelia Person  Performed By:     Dorena Dew     [facility]                                                             [place] [facility]                                                             [facility] ---------------------------------------------------------------------- Orders  #  Description                           Code        Ordered By  1  [place] MFM OB FOLLOW UP  [id number]    PAULA DUNCAN  2  [place] MFM OB FOLLOW UP ADDL              [id number]    PAULA DUNCAN     GEST  3  [place] MFM FETAL BPP WO NON               76819.01    PAULA DUNCAN     STRESS  4  [place] MFM FETAL BPP WO NON               76819.01    PAULA DUNCAN     STRESS  5  [place] MFM UA CORD DOPPLER                76820.02    PAULA DUNCAN  6  [place] MFM UA ADDL GEST                   76820.01    Radene Gunning ----------------------------------------------------------------------  #  Order #                      Accession #                Episode #  1  [id number]                   [id number]                 [id number]  2  [id number]                   [id number]                 GR:[id number]  3  [id number]                   [id number]                 GR:[id number]  4  [id number]                   [id number]                 GR:[id number]  5  [id number]                   [id number]                 GR:[id number]  6  [id number]                   [id number]                 GR:[id number] ---------------------------------------------------------------------- Indications  Twin pregnancy, di/di, third trimester         O30.043  [redacted] weeks gestation of pregnancy                Z3A.31  Marginal insertion of umbilical cord affecting O43.193  management of mother in third trimester  (Twin B)  Echogenic intracardiac focus of the heart      O35.8XX0  (EIF) Twin B  Premature rupture of membranes - leaking       O42.90  fluid (2-15) ---------------------------------------------------------------------- Fetal Evaluation (Fetus A)  Num Of Fetuses:         2  Fetal Heart Rate(bpm):  128  Cardiac Activity:       Observed  Fetal Lie:              Maternal left side  Presentation:  Cephalic  Placenta:               Posterior  P. Cord Insertion:      Previously Visualized  Amniotic Fluid  AFI FV:      Within normal limits                              Largest Pocket(cm)                              6.6 ---------------------------------------------------------------------- Biophysical Evaluation (Fetus A)  Amniotic F.V:   Within normal limits       F. Tone:        Observed  F. Movement:    Observed                   Score:          8/8  F. Breathing:   Observed ---------------------------------------------------------------------- Biometry (Fetus A)  BPD:      78.4  mm     G. Age:  31w 3d         45  %    CI:        75.23   %    70 - 86                                                          FL/HC:      19.7   %    19.3 - 21.3  HC:      286.7  mm     G. Age:  31w  4d         20  %    HC/AC:      1.14        0.96 - 1.17  AC:      251.7  mm     G. Age:  29w 3d          6  %    FL/BPD:     71.9   %    71 - 87  FL:       56.4  mm     G. Age:  29w 4d          5  %    FL/AC:      22.4   %    20 - 24  HUM:      49.5  mm     G. Age:  29w 1d        < 5  %  Est. FW:    1459  gm      3 lb 3 oz      6  %     FW Discordancy     0 \ 12 % ---------------------------------------------------------------------- OB History  Gravidity:    1         Term:   0        Prem:   0        SAB:   0  TOP:          0       Ectopic:  0        Living: 0 ---------------------------------------------------------------------- Gestational Age (  Fetus A)  LMP:           33w 3d        Date:  02/16/21                 EDD:   11/23/21  U/S Today:     30w 4d                                        EDD:   12/13/21  Best:          31w 2d     Det. By:  U/S Fetus Avg.           EDD:   12/08/21                                      (07/23/21) ---------------------------------------------------------------------- Anatomy (Fetus A)  Cranium:               Appears normal         LVOT:                   Previously seen  Cavum:                 Previously seen        Aortic Arch:            Previously seen  Ventricles:            Appears normal         Ductal Arch:            Appears normal  Choroid Plexus:        Previously seen        Diaphragm:              Previously seen  Cerebellum:            Previously seen        Stomach:                Appears normal, left                                                                        sided  Posterior Fossa:       Previously seen        Abdomen:                Previously seen  Nuchal Fold:           Previously seen        Abdominal Wall:         Previously seen  Face:                  Orbits and profile     Cord Vessels:           Previously seen                         previously seen  Lips:  Appears normal         Kidneys:                Appear normal  Palate:                 Previously seen        Bladder:                Appears normal  Thoracic:              Previously seen        Spine:                  Previously seen  Heart:                 Appears normal         Upper Extremities:      Previously seen                         (4CH, axis, and                         situs)  RVOT:                  Previously seen        Lower Extremities:      Previously seen  Other:  Heels and 5th digit prev. visualized. Technically difficult due to fetal          position. ---------------------------------------------------------------------- Doppler - Fetal Vessels (Fetus A)  Umbilical Artery   S/D     %tile      RI    %tile      PI    %tile            ADFV    RDFV   3.76       92    0.73       90    1.31       96               No      No ---------------------------------------------------------------------- Fetal Evaluation (Fetus B)  Num Of Fetuses:         2  Fetal Heart Rate(bpm):  150  Cardiac Activity:       Observed  Fetal Lie:              Maternal right side  Presentation:           Breech  Placenta:               Posterior  P. Cord Insertion:      Marginal insertion  Amniotic Fluid  AFI FV:      Within normal limits                              Largest Pocket(cm)                              6.4 ---------------------------------------------------------------------- Biophysical Evaluation (Fetus B)  Amniotic F.V:   Within normal limits       F. Tone:        Observed  F. Movement:    Observed                   Score:  8/8  F. Breathing:   Observed ---------------------------------------------------------------------- Biometry (Fetus B)  BPD:      72.2  mm     G. Age:  29w 0d        1.4  %    CI:        70.34   %    70 - 86                                                          FL/HC:      19.7   %    19.3 - 21.3  HC:      274.5  mm     G. Age:  30w 0d        1.9  %    HC/AC:      1.12        0.96 - 1.17  AC:      244.1  mm     G. Age:  28w 5d        1.5  %    FL/BPD:      75.1   %    71 - 87  FL:       54.2  mm     G. Age:  28w 5d        < 1  %    FL/AC:      22.2   %    20 - 24  HUM:        47  mm     G. Age:  27w 5d        < 5  %  Est. FW:    1289  gm    2 lb 13 oz     1.2  %     FW Discordancy        12  % ---------------------------------------------------------------------- Gestational Age (Fetus B)  LMP:           33w 3d        Date:  02/16/21                 EDD:   11/23/21  U/S Today:     29w 1d                                        EDD:   12/23/21  Best:          31w 2d     Det. By:  U/S Fetus Avg.           EDD:   12/08/21                                      (07/23/21) ---------------------------------------------------------------------- Anatomy (Fetus B)  Cranium:               Appears normal         Aortic Arch:            Previously seen  Cavum:                 Previously seen  Ductal Arch:            Previously seen  Ventricles:            Appears normal         Diaphragm:              Previously seen  Choroid Plexus:        Previously seen        Stomach:                Appears normal, left                                                                        sided  Cerebellum:            Previously seen        Abdomen:                Previously seen  Posterior Fossa:       Previously seen        Abdominal Wall:         Previously seen  Nuchal Fold:           Previously seen        Cord Vessels:           Previously seen  Face:                  Orbits and profile     Kidneys:                Appear normal                         previously seen  Lips:                  Previously seen        Bladder:                Appears normal  Thoracic:              Previously seen        Spine:                  Previously seen  Heart:                 Appears normal; EIF    Upper Extremities:      Previously seen  RVOT:                  Appears normal         Lower Extremities:      Previously seen  LVOT:                  Previously seen  Other:  Heels and 5th digit  prev. visualized.3VV and 3VT normal. ---------------------------------------------------------------------- Doppler - Fetal Vessels (Fetus B)  Umbilical Artery   S/D     %tile      RI    %tile      PI    %tile            ADFV    RDFV   3.53       86  0.72       88    1.27       95               No      No ---------------------------------------------------------------------- Impression  Dichorionic-diamniotic twin pregnancy.  Twin A: Maternal left, cephalic presentation, posterior  placenta.  Amniotic fluid is normal and good fetal activity  seen.  Subjectively the amniotic fluid is slower than that seen  in twin B sac.  The estimated fetal weight is at the 6th  percentile.  The abdominal circumference measurement is at  the 6th percentile.  Cephalic presentation and the head is  very low and entering into the pelvis.  Antenatal testing is reassuring.  Umbilical artery Doppler  showed normal forward diastolic flow.  BPP 8/8.  Twin B: Maternal right, breech presentation, posterior  placenta.  Amniotic fluid is normal and good fetal activity  seen.  Estimated fetal weight is at the 1st percentile.  Abdominal circumference measurement is at the 2nd  percentile.  Head circumference measurement is at -1 SD  (normal).  Antenatal testing is reassuring.  Umbilical artery Doppler  showed normal forward diastolic flow.  BPP 8/8.  xxxxxxxxxxxxxxxxxxxxxxxxxxxxxxxxxxxxxxxxxxxxxxxx  Consultation (see EPIC )  I had the pleasure of seeing Ms. Aguiniga today at the [facility]. She is G1 P0 at 31w 2d gestation  with dichorionic-diamniotic twin pregnancy. She was  accompanied by her mother.  On ultrasound performed 3 weeks ago, the estimated fetal  weight of twin A was at the 25th percentile and that of twin B  was at the 11th percentile. Marginal cord insertion was seen  in twin B.  2 days ago, she was admitted to [facility] specialty care after  evaluation at the [facility] that confirmed preterm labor and  possible preterm  premature rupture of membranes.  AmniSure was positive and the patient had leakage of  amniotic fluid.  The cervix was 5 cm dilated. She received a  course of betamethasone.  On the same day she was  admitted, she signed out [place]. She reports she takes oral  antibiotics.  Patient does not have uterine contractions but reports mild  leakage of amniotic fluid.  She does not have fever or vaginal  bleeding.  Her blood pressure today at her office is 127/74 mmHg.  She does not have gestational diabetes.  I counseled the patient on the following:  Twin pregnancy with PPROM and preterm labor  I explained the diagnosis of PPROM made by her provider  based on her symptoms and positive amnisure test. Patient  still has leakage of amniotic fluid.  PPROM can be associated with complications including but  not limited to intraamniotic and fetal infections, cord prolapse  and placental abruption.  Inpatient management is recommended till delivery.  I counseled the patient that we will recommend delivery at 34  weeks' gestation  I explained the significance of cervical dilation of 5 cm and  that it can lead to quick delivery usually at home. A growth-  restricted premature infant may require prompt respiratory  support that is not available at home. Failure to resuscitate  may lead to severe neurological morbidities.  I strongly emphasized the importance of inpatient  management and advised her to return to the hospital. Her  mother informed that she is in favor of our patient being  admitted to the hospital.  I explained that fetal growth restriction is more-likely from  placental insufficiency and  that weekly ultrasound for BPP  and UA Dopplers are recommended. ---------------------------------------------------------------------- Recommendations  -Advised to get admitted.  -Delivery at home is possible if patient is not admitted. I  discussed measures with patient's mother including cleaning  the baby/babies and keeping them  warm before coming to  the hospital.  -Patient will discuss at home and decide.  -We made an appointment for her to return next week for  BPP and UA Doppler studies. ----------------------------------------------------------------------                  Tama High, MD Electronically Signed Final Report   10/08/2021 03:07 pm ----------------------------------------------------------------------  [place] MFM OB FOLLOW UP ADDL GEST  Result Date: 09/15/2021 ----------------------------------------------------------------------  OBSTETRICS REPORT                       (Signed Final 09/15/2021 11:45 am) ---------------------------------------------------------------------- Patient Info  ID #:       [id number]                          D.O.B.:  24-Dec-2000 (20 yrs)  Name:       Chelsea Ellis               Visit Date: 09/15/2021 10:46 am ---------------------------------------------------------------------- Performed By  Attending:        Tama High MD        Referred By:      Bedelia Person  Performed By:     Lelan Pons RDMS       [facility]         [facility] at                                                             [place] [facility]                                                             Women ---------------------------------------------------------------------- Orders  #  Description                           Code        Ordered By  1  [place] MFM OB FOLLOW UP                   [id number] 555 4852.01  RAVI SHANKAR  2  [place] MFM OB FOLLOW UP ADDL              [id number]    RAVI SHANKAR     GEST ----------------------------------------------------------------------  #  Order #                     Accession #                Episode #  1  [id number]                   [id number]                 [id number]  2  [id number]                   [id number]                 [id number]  ---------------------------------------------------------------------- Indications  Twin pregnancy, di/di, second trimester        O30.042  Marginal insertion of umbilical cord affecting O43.192  management of mother in second trimester  (Twin B)  Echogenic intracardiac focus of the heart      O35.8XX0  (EIF) Twin B  [redacted] weeks gestation of pregnancy                Z3A.28 ---------------------------------------------------------------------- Fetal Evaluation (Fetus A)  Num Of Fetuses:         2  Fetal Heart Rate(bpm):  144  Cardiac Activity:       Observed  Fetal Lie:              Maternal left side  Presentation:           Cephalic  Placenta:               Posterior  P. Cord Insertion:      Previously Visualized  Membrane Desc:      Dividing Membrane seen - Dichorionic.  Amniotic Fluid  AFI FV:      Within normal limits                              Largest Pocket(cm)                              7.5 ---------------------------------------------------------------------- Biometry (Fetus A)  BPD:      68.7  mm     G. Age:  27w 4d         26  %    CI:        72.51   %    70 - 86                                                          FL/HC:      20.4   %    18.8 - 20.6  HC:      256.6  mm     G. Age:  27w 6d         17  %    HC/AC:      1.11        1.05 - 1.21  AC:  230.6  mm     G. Age:  27w 3d         25  %    FL/BPD:     76.3   %    71 - 87  FL:       52.4  mm     G. Age:  27w 6d         32  %    FL/AC:      22.7   %    20 - 24  Est. FW:    1105  gm      2 lb 7 oz     25  %     FW Discordancy      0 \ 7 % ---------------------------------------------------------------------- OB History  Gravidity:    1         Term:   0        Prem:   0        SAB:   0  TOP:          0       Ectopic:  0        Living: 0 ---------------------------------------------------------------------- Gestational Age (Fetus A)  LMP:           30w 1d        Date:  02/16/21                 EDD:   11/23/21  U/S Today:     27w 5d                                         EDD:   12/10/21  Best:          28w 0d     Det. By:  U/S Fetus Avg.           EDD:   12/08/21                                      (07/23/21) ---------------------------------------------------------------------- Anatomy (Fetus A)  Cranium:               Appears normal         LVOT:                   Previously seen  Cavum:                 Previously seen        Aortic Arch:            Previously seen  Ventricles:            Appears normal         Ductal Arch:            Appears normal  Choroid Plexus:        Previously seen        Diaphragm:              Previously seen  Cerebellum:            Previously seen        Stomach:                Appears normal, left  sided  Posterior Fossa:       Previously seen        Abdomen:                Previously seen  Nuchal Fold:           Previously seen        Abdominal Wall:         Previously seen  Face:                  Orbits and profile     Cord Vessels:           Previously seen                         previously seen  Lips:                  Appears normal         Kidneys:                Appear normal  Palate:                Previously seen        Bladder:                Appears normal  Thoracic:              Previously seen        Spine:                  Previously seen  Heart:                 Appears normal         Upper Extremities:      Previously seen                         (4CH, axis, and                         situs)  RVOT:                  Previously seen        Lower Extremities:      Previously seen  Other:  Heels and 5th digit prev. visualized. Technically difficult due to fetal          position. ---------------------------------------------------------------------- Fetal Evaluation (Fetus B)  Num Of Fetuses:         2  Fetal Heart Rate(bpm):  136  Cardiac Activity:       Observed  Fetal Lie:              Maternal right side  Presentation:           Breech   Placenta:               Posterior  P. Cord Insertion:      Marginal insertion  Membrane Desc:      Dividing Membrane seen - Dichorionic.  Amniotic Fluid  AFI FV:      Within normal limits                              Largest Pocket(cm)  7.4 ---------------------------------------------------------------------- Biometry (Fetus B)  BPD:      66.5  mm     G. Age:  26w 6d          9  %    CI:         69.7   %    70 - 86                                                          FL/HC:      19.2   %    18.8 - 20.6  HC:      254.2  mm     G. Age:  27w 4d         12  %    HC/AC:      1.10        1.05 - 1.21  AC:      231.4  mm     G. Age:  27w 3d         25  %    FL/BPD:     73.4   %    71 - 87  FL:       48.8  mm     G. Age:  26w 3d        4.7  %    FL/AC:      21.1   %    20 - 24  LV:        4.4  mm  Est. FW:    1025  gm      2 lb 4 oz     11  %     FW Discordancy         7  % ---------------------------------------------------------------------- Gestational Age (Fetus B)  LMP:           30w 1d        Date:  02/16/21                 EDD:   11/23/21  U/S Today:     27w 1d                                        EDD:   12/14/21  Best:          28w 0d     Det. By:  U/S Fetus Avg.           EDD:   12/08/21                                      (07/23/21) ---------------------------------------------------------------------- Anatomy (Fetus B)  Cranium:               Appears normal         Aortic Arch:            Previously seen  Cavum:                 Previously seen        Ductal Arch:            Previously seen  Ventricles:  Appears normal         Diaphragm:              Previously seen  Choroid Plexus:        Previously seen        Stomach:                Appears normal, left                                                                        sided  Cerebellum:            Previously seen        Abdomen:                Previously seen  Posterior Fossa:       Previously seen         Abdominal Wall:         Previously seen  Nuchal Fold:           Previously seen        Cord Vessels:           Previously seen  Face:                  Orbits and profile     Kidneys:                Appear normal                         previously seen  Lips:                  Previously seen        Bladder:                Appears normal  Thoracic:              Previously seen        Spine:                  Previously seen  Heart:                 Appears normal; EIF    Upper Extremities:      Previously seen  RVOT:                  Appears normal         Lower Extremities:      Previously seen  LVOT:                  Previously seen  Other:  Heels and 5th digit prev. visualized.3VV and 3VT normal. ---------------------------------------------------------------------- Cervix Uterus Adnexa  Cervix  Not visualized (advanced GA >24wks)  Right Ovary  Not visualized.  Left Ovary  Not visualized. ---------------------------------------------------------------------- Impression  Dichorionic-diamniotic twin pregnancy.  Patient returned for fetal growth assessment.  She does not have gestational diabetes.  Blood pressure  today at her office is 138/79 mmHg.  Twin A: Maternal left, cephalic presentation, posterior  placenta.  Amniotic fluid is normal and good fetal activity  seen.  Fetal growth is appropriate for gestational age.  Twin B: Maternal right, breech presentation, posterior  placenta.  Amniotic fluid is normal good fetal activity seen.  Fetal growth is appropriate for gestational age.  The  estimated fetal weight is at the 11th percentile.  Growth discordancy: 7% (normal).  I explained the finding of normal fetal growth assessments  and the potential fetal growth restriction in twin B because of  the estimated fetal weight and the marginal cord insertion. ---------------------------------------------------------------------- Recommendations  -An appointment was made for her to return in 4 weeks for  fetal growth  assessment.  -BPP and UA Doppler if fetal growth restriction is seen in  either twin. ----------------------------------------------------------------------                  Tama High, MD Electronically Signed Final Report   09/15/2021 11:45 am ----------------------------------------------------------------------  [place] MFM UA ADDL GEST  Result Date: 10/08/2021 ----------------------------------------------------------------------  OBSTETRICS REPORT                       (Signed Final 10/08/2021 03:07 pm) ---------------------------------------------------------------------- Patient Info  ID #:       [id number]                          D.O.B.:  2001-04-28 (20 yrs)  Name:       Chelsea Ellis               Visit Date: 10/08/2021 12:47 pm ---------------------------------------------------------------------- Performed By  Attending:        Tama High MD        Referred By:      Bedelia Person  Performed By:     Dorena Dew     [facility]                    BS, [facility]                                 [facility] at                                                             [facility]                                                             [facility] ---------------------------------------------------------------------- Orders  #  Description                           Code        Ordered By  1  [place] MFM OB FOLLOW UP  [id number]    PAULA DUNCAN  2  [place] MFM OB FOLLOW UP ADDL              [id number]    PAULA DUNCAN     GEST  3  [place] MFM FETAL BPP WO NON               76819.01    PAULA DUNCAN     STRESS  4  [place] MFM FETAL BPP WO NON               76819.01    PAULA DUNCAN     STRESS  5  [place] MFM UA CORD DOPPLER                76820.02    PAULA DUNCAN  6  [place] MFM UA ADDL GEST                   76820.01    Radene Gunning ----------------------------------------------------------------------  #  Order #                      Accession #                Episode #  1  [id number]                   [id number]                 GR:[id number]  2  [id number]                   [id number]                 GR:[id number]  3  [id number]                   [id number]                 GR:[id number]  4  [id number]                   [id number]                 GR:[id number]  5  [id number]                   [id number]                 GR:[id number]  6  [id number]                   [id number]                 GR:[id number] ---------------------------------------------------------------------- Indications  Twin pregnancy, di/di, third trimester         O30.043  [redacted] weeks gestation of pregnancy                Z3A.31  Marginal insertion of umbilical cord affecting O43.193  management of mother in third trimester  (Twin B)  Echogenic intracardiac focus of the heart      O35.8XX0  (EIF) Twin B  Premature rupture of membranes - leaking       O42.90  fluid (2-15) ---------------------------------------------------------------------- Fetal Evaluation (Fetus A)  Num Of Fetuses:         2  Fetal Heart Rate(bpm):  128  Cardiac Activity:       Observed  Fetal Lie:              Maternal left side  Presentation:  Cephalic  Placenta:               Posterior  P. Cord Insertion:      Previously Visualized  Amniotic Fluid  AFI FV:      Within normal limits                              Largest Pocket(cm)                              6.6 ---------------------------------------------------------------------- Biophysical Evaluation (Fetus A)  Amniotic F.V:   Within normal limits       F. Tone:        Observed  F. Movement:    Observed                   Score:          8/8  F. Breathing:   Observed ---------------------------------------------------------------------- Biometry (Fetus A)  BPD:      78.4  mm     G. Age:  31w 3d         45  %    CI:        75.23   %    70 - 86                                                          FL/HC:      19.7   %    19.3 - 21.3  HC:      286.7  mm     G. Age:  31w 4d          20  %    HC/AC:      1.14        0.96 - 1.17  AC:      251.7  mm     G. Age:  29w 3d          6  %    FL/BPD:     71.9   %    71 - 87  FL:       56.4  mm     G. Age:  29w 4d          5  %    FL/AC:      22.4   %    20 - 24  HUM:      49.5  mm     G. Age:  29w 1d        < 5  %  Est. FW:    1459  gm      3 lb 3 oz      6  %     FW Discordancy     0 \ 12 % ---------------------------------------------------------------------- OB History  Gravidity:    1         Term:   0        Prem:   0        SAB:   0  TOP:          0       Ectopic:  0        Living: 0 ---------------------------------------------------------------------- Gestational Age (  Fetus A)  LMP:           33w 3d        Date:  02/16/21                 EDD:   11/23/21  U/S Today:     30w 4d                                        EDD:   12/13/21  Best:          31w 2d     Det. By:  U/S Fetus Avg.           EDD:   12/08/21                                      (07/23/21) ---------------------------------------------------------------------- Anatomy (Fetus A)  Cranium:               Appears normal         LVOT:                   Previously seen  Cavum:                 Previously seen        Aortic Arch:            Previously seen  Ventricles:            Appears normal         Ductal Arch:            Appears normal  Choroid Plexus:        Previously seen        Diaphragm:              Previously seen  Cerebellum:            Previously seen        Stomach:                Appears normal, left                                                                        sided  Posterior Fossa:       Previously seen        Abdomen:                Previously seen  Nuchal Fold:           Previously seen        Abdominal Wall:         Previously seen  Face:                  Orbits and profile     Cord Vessels:           Previously seen                         previously seen  Lips:  Appears normal         Kidneys:                Appear normal  Palate:                 Previously seen        Bladder:                Appears normal  Thoracic:              Previously seen        Spine:                  Previously seen  Heart:                 Appears normal         Upper Extremities:      Previously seen                         (4CH, axis, and                         situs)  RVOT:                  Previously seen        Lower Extremities:      Previously seen  Other:  Heels and 5th digit prev. visualized. Technically difficult due to fetal          position. ---------------------------------------------------------------------- Doppler - Fetal Vessels (Fetus A)  Umbilical Artery   S/D     %tile      RI    %tile      PI    %tile            ADFV    RDFV   3.76       92    0.73       90    1.31       96               No      No ---------------------------------------------------------------------- Fetal Evaluation (Fetus B)  Num Of Fetuses:         2  Fetal Heart Rate(bpm):  150  Cardiac Activity:       Observed  Fetal Lie:              Maternal right side  Presentation:           Breech  Placenta:               Posterior  P. Cord Insertion:      Marginal insertion  Amniotic Fluid  AFI FV:      Within normal limits                              Largest Pocket(cm)                              6.4 ---------------------------------------------------------------------- Biophysical Evaluation (Fetus B)  Amniotic F.V:   Within normal limits       F. Tone:        Observed  F. Movement:    Observed                   Score:  8/8  F. Breathing:   Observed ---------------------------------------------------------------------- Biometry (Fetus B)  BPD:      72.2  mm     G. Age:  29w 0d        1.4  %    CI:        70.34   %    70 - 86                                                          FL/HC:      19.7   %    19.3 - 21.3  HC:      274.5  mm     G. Age:  30w 0d        1.9  %    HC/AC:      1.12        0.96 - 1.17  AC:      244.1  mm     G. Age:  28w 5d        1.5  %    FL/BPD:      75.1   %    71 - 87  FL:       54.2  mm     G. Age:  28w 5d        < 1  %    FL/AC:      22.2   %    20 - 24  HUM:        47  mm     G. Age:  27w 5d        < 5  %  Est. FW:    1289  gm    2 lb 13 oz     1.2  %     FW Discordancy        12  % ---------------------------------------------------------------------- Gestational Age (Fetus B)  LMP:           33w 3d        Date:  02/16/21                 EDD:   11/23/21  U/S Today:     29w 1d                                        EDD:   12/23/21  Best:          31w 2d     Det. By:  U/S Fetus Avg.           EDD:   12/08/21                                      (07/23/21) ---------------------------------------------------------------------- Anatomy (Fetus B)  Cranium:               Appears normal         Aortic Arch:            Previously seen  Cavum:                 Previously seen  Ductal Arch:            Previously seen  Ventricles:            Appears normal         Diaphragm:              Previously seen  Choroid Plexus:        Previously seen        Stomach:                Appears normal, left                                                                        sided  Cerebellum:            Previously seen        Abdomen:                Previously seen  Posterior Fossa:       Previously seen        Abdominal Wall:         Previously seen  Nuchal Fold:           Previously seen        Cord Vessels:           Previously seen  Face:                  Orbits and profile     Kidneys:                Appear normal                         previously seen  Lips:                  Previously seen        Bladder:                Appears normal  Thoracic:              Previously seen        Spine:                  Previously seen  Heart:                 Appears normal; EIF    Upper Extremities:      Previously seen  RVOT:                  Appears normal         Lower Extremities:      Previously seen  LVOT:                  Previously seen  Other:  Heels and 5th digit prev.  visualized.3VV and 3VT normal. ---------------------------------------------------------------------- Doppler - Fetal Vessels (Fetus B)  Umbilical Artery   S/D     %tile      RI    %tile      PI    %tile            ADFV    RDFV   3.53       86  0.72       88    1.27       95               No      No ---------------------------------------------------------------------- Impression  Dichorionic-diamniotic twin pregnancy.  Twin A: Maternal left, cephalic presentation, posterior  placenta.  Amniotic fluid is normal and good fetal activity  seen.  Subjectively the amniotic fluid is slower than that seen  in twin B sac.  The estimated fetal weight is at the 6th  percentile.  The abdominal circumference measurement is at  the 6th percentile.  Cephalic presentation and the head is  very low and entering into the pelvis.  Antenatal testing is reassuring.  Umbilical artery Doppler  showed normal forward diastolic flow.  BPP 8/8.  Twin B: Maternal right, breech presentation, posterior  placenta.  Amniotic fluid is normal and good fetal activity  seen.  Estimated fetal weight is at the 1st percentile.  Abdominal circumference measurement is at the 2nd  percentile.  Head circumference measurement is at -1 SD  (normal).  Antenatal testing is reassuring.  Umbilical artery Doppler  showed normal forward diastolic flow.  BPP 8/8.  xxxxxxxxxxxxxxxxxxxxxxxxxxxxxxxxxxxxxxxxxxxxxxxx  Consultation (see EPIC )  I had the pleasure of seeing Ms. Pines today at the [facility]. She is G1 P0 at 31w 2d gestation  with dichorionic-diamniotic twin pregnancy. She was  accompanied by her mother.  On ultrasound performed 3 weeks ago, the estimated fetal  weight of twin A was at the 25th percentile and that of twin B  was at the 11th percentile. Marginal cord insertion was seen  in twin B.  2 days ago, she was admitted to [facility] specialty care after  evaluation at the [facility] that confirmed preterm labor and  possible preterm  premature rupture of membranes.  AmniSure was positive and the patient had leakage of  amniotic fluid.  The cervix was 5 cm dilated. She received a  course of betamethasone.  On the same day she was  admitted, she signed out [place]. She reports she takes oral  antibiotics.  Patient does not have uterine contractions but reports mild  leakage of amniotic fluid.  She does not have fever or vaginal  bleeding.  Her blood pressure today at her office is 127/74 mmHg.  She does not have gestational diabetes.  I counseled the patient on the following:  Twin pregnancy with PPROM and preterm labor  I explained the diagnosis of PPROM made by her provider  based on her symptoms and positive amnisure test. Patient  still has leakage of amniotic fluid.  PPROM can be associated with complications including but  not limited to intraamniotic and fetal infections, cord prolapse  and placental abruption.  Inpatient management is recommended till delivery.  I counseled the patient that we will recommend delivery at 34  weeks' gestation  I explained the significance of cervical dilation of 5 cm and  that it can lead to quick delivery usually at home. A growth-  restricted premature infant may require prompt respiratory  support that is not available at home. Failure to resuscitate  may lead to severe neurological morbidities.  I strongly emphasized the importance of inpatient  management and advised her to return to the hospital. Her  mother informed that she is in favor of our patient being  admitted to the hospital.  I explained that fetal growth restriction is more-likely from  placental insufficiency and  that weekly ultrasound for BPP  and UA Dopplers are recommended. ---------------------------------------------------------------------- Recommendations  -Advised to get admitted.  -Delivery at home is possible if patient is not admitted. I  discussed measures with patient's mother including cleaning  the baby/babies and keeping them  warm before coming to  the hospital.  -Patient will discuss at home and decide.  -We made an appointment for her to return next week for  BPP and UA Doppler studies. ----------------------------------------------------------------------                  Tama High, MD Electronically Signed Final Report   10/08/2021 03:07 pm ----------------------------------------------------------------------  [place] MFM UA CORD DOPPLER  Result Date: 10/08/2021 ----------------------------------------------------------------------  OBSTETRICS REPORT                       (Signed Final 10/08/2021 03:07 pm) ---------------------------------------------------------------------- Patient Info  ID #:       [id number]                          D.O.B.:  Sep 23, 2000 (20 yrs)  Name:       Chelsea Ellis               Visit Date: 10/08/2021 12:47 pm ---------------------------------------------------------------------- Performed By  Attending:        Tama High MD        Referred By:      Bedelia Person  Performed By:     Dorena Dew     [facility]                    BS, RDMS                                 [facility] at                                                             [place] [facility]                                                             [facility] ---------------------------------------------------------------------- Orders  #  Description                           Code        Ordered By  1  [place] MFM OB FOLLOW UP                   [id number]    PAULA DUNCAN  2  [place] MFM OB FOLLOW  UP ADDL              40981.19    PAULA DUNCAN     GEST  3  [place] MFM FETAL BPP WO NON               76819.01    PAULA DUNCAN     STRESS  4  [place] MFM FETAL BPP WO NON               76819.01    PAULA DUNCAN     STRESS  5  [place] MFM UA CORD DOPPLER                76820.02    PAULA DUNCAN  6  [place] MFM UA ADDL GEST                   76820.01    Milas Hock  ----------------------------------------------------------------------  #  Order #                     Accession #                Episode #  1  [id number]                   [id number]                 [id number]  2  [id number]                   [id number]                 [id number]  3  [id number]                   [id number]                 [id number]  4  [id number]                   [id number]                 [id number]  5  [id number]                   [id number]                 [id number]  6  [id number]                   [id number]                 [id number] ---------------------------------------------------------------------- Indications  Twin pregnancy, di/di, third trimester         O30.043  [redacted] weeks gestation of pregnancy                Z3A.31  Marginal insertion of umbilical cord affecting O43.193  management of mother in third trimester  (Twin B)  Echogenic intracardiac focus of the heart      O35.8XX0  (EIF) Twin B  Premature rupture of membranes - leaking       O42.90  fluid (2-15) ---------------------------------------------------------------------- Fetal Evaluation (Fetus A)  Num Of Fetuses:         2  Fetal Heart Rate(bpm):  128  Cardiac Activity:       Observed  Fetal Lie:              Maternal left side  Presentation:           Cephalic  Placenta:  Posterior  P. Cord Insertion:      Previously Visualized  Amniotic Fluid  AFI FV:      Within normal limits                              Largest Pocket(cm)                              6.6 ---------------------------------------------------------------------- Biophysical Evaluation (Fetus A)  Amniotic F.V:   Within normal limits       F. Tone:        Observed  F. Movement:    Observed                   Score:          8/8  F. Breathing:   Observed ---------------------------------------------------------------------- Biometry (Fetus A)  BPD:      78.4  mm     G. Age:  31w 3d         45  %    CI:        75.23   %    70 - 86                                                           FL/HC:      19.7   %    19.3 - 21.3  HC:      286.7  mm     G. Age:  31w 4d         20  %    HC/AC:      1.14        0.96 - 1.17  AC:      251.7  mm     G. Age:  29w 3d          6  %    FL/BPD:     71.9   %    71 - 87  FL:       56.4  mm     G. Age:  29w 4d          5  %    FL/AC:      22.4   %    20 - 24  HUM:      49.5  mm     G. Age:  29w 1d        < 5  %  Est. FW:    1459  gm      3 lb 3 oz      6  %     FW Discordancy     0 \ 12 % ---------------------------------------------------------------------- OB History  Gravidity:    1         Term:   0        Prem:   0        SAB:   0  TOP:          0       Ectopic:  0        Living: 0 ---------------------------------------------------------------------- Gestational Age (Fetus A)  LMP:           33w 3d  Date:  02/16/21                 EDD:   11/23/21  U/S Today:     30w 4d                                        EDD:   12/13/21  Best:          31w 2d     Det. By:  U/S Fetus Avg.           EDD:   12/08/21                                      (07/23/21) ---------------------------------------------------------------------- Anatomy (Fetus A)  Cranium:               Appears normal         LVOT:                   Previously seen  Cavum:                 Previously seen        Aortic Arch:            Previously seen  Ventricles:            Appears normal         Ductal Arch:            Appears normal  Choroid Plexus:        Previously seen        Diaphragm:              Previously seen  Cerebellum:            Previously seen        Stomach:                Appears normal, left                                                                        sided  Posterior Fossa:       Previously seen        Abdomen:                Previously seen  Nuchal Fold:           Previously seen        Abdominal Wall:         Previously seen  Face:                  Orbits and profile     Cord Vessels:           Previously seen                         previously  seen  Lips:                  Appears normal         Kidneys:  Appear normal  Palate:                Previously seen        Bladder:                Appears normal  Thoracic:              Previously seen        Spine:                  Previously seen  Heart:                 Appears normal         Upper Extremities:      Previously seen                         (4CH, axis, and                         situs)  RVOT:                  Previously seen        Lower Extremities:      Previously seen  Other:  Heels and 5th digit prev. visualized. Technically difficult due to fetal          position. ---------------------------------------------------------------------- Doppler - Fetal Vessels (Fetus A)  Umbilical Artery   S/D     %tile      RI    %tile      PI    %tile            ADFV    RDFV   3.76       92    0.73       90    1.31       96               No      No ---------------------------------------------------------------------- Fetal Evaluation (Fetus B)  Num Of Fetuses:         2  Fetal Heart Rate(bpm):  150  Cardiac Activity:       Observed  Fetal Lie:              Maternal right side  Presentation:           Breech  Placenta:               Posterior  P. Cord Insertion:      Marginal insertion  Amniotic Fluid  AFI FV:      Within normal limits                              Largest Pocket(cm)                              6.4 ---------------------------------------------------------------------- Biophysical Evaluation (Fetus B)  Amniotic F.V:   Within normal limits       F. Tone:        Observed  F. Movement:    Observed                   Score:          8/8  F. Breathing:   Observed ---------------------------------------------------------------------- Biometry (Fetus B)  BPD:      72.2  mm     G. Age:  29w 0d        1.4  %    CI:        70.34   %    70 - 86                                                          FL/HC:      19.7   %    19.3 - 21.3  HC:      274.5  mm     G. Age:  30w 0d        1.9  %     HC/AC:      1.12        0.96 - 1.17  AC:      244.1  mm     G. Age:  28w 5d        1.5  %    FL/BPD:     75.1   %    71 - 87  FL:       54.2  mm     G. Age:  28w 5d        < 1  %    FL/AC:      22.2   %    20 - 24  HUM:        47  mm     G. Age:  27w 5d        < 5  %  Est. FW:    1289  gm    2 lb 13 oz     1.2  %     FW Discordancy        12  % ---------------------------------------------------------------------- Gestational Age (Fetus B)  LMP:           33w 3d        Date:  02/16/21                 EDD:   11/23/21  U/S Today:     29w 1d                                        EDD:   12/23/21  Best:          31w 2d     Det. By:  U/S Fetus Avg.           EDD:   12/08/21                                      (07/23/21) ---------------------------------------------------------------------- Anatomy (Fetus B)  Cranium:               Appears normal         Aortic Arch:            Previously seen  Cavum:                 Previously seen        Ductal Arch:            Previously seen  Ventricles:  Appears normal         Diaphragm:              Previously seen  Choroid Plexus:        Previously seen        Stomach:                Appears normal, left                                                                        sided  Cerebellum:            Previously seen        Abdomen:                Previously seen  Posterior Fossa:       Previously seen        Abdominal Wall:         Previously seen  Nuchal Fold:           Previously seen        Cord Vessels:           Previously seen  Face:                  Orbits and profile     Kidneys:                Appear normal                         previously seen  Lips:                  Previously seen        Bladder:                Appears normal  Thoracic:              Previously seen        Spine:                  Previously seen  Heart:                 Appears normal; EIF    Upper Extremities:      Previously seen  RVOT:                  Appears normal         Lower  Extremities:      Previously seen  LVOT:                  Previously seen  Other:  Heels and 5th digit prev. visualized.3VV and 3VT normal. ---------------------------------------------------------------------- Doppler - Fetal Vessels (Fetus B)  Umbilical Artery   S/D     %tile      RI    %tile      PI    %tile            ADFV    RDFV   3.53       86    0.72       88    1.27       95  No      No ---------------------------------------------------------------------- Impression  Dichorionic-diamniotic twin pregnancy.  Twin A: Maternal left, cephalic presentation, posterior  placenta.  Amniotic fluid is normal and good fetal activity  seen.  Subjectively the amniotic fluid is slower than that seen  in twin B sac.  The estimated fetal weight is at the 6th  percentile.  The abdominal circumference measurement is at  the 6th percentile.  Cephalic presentation and the head is  very low and entering into the pelvis.  Antenatal testing is reassuring.  Umbilical artery Doppler  showed normal forward diastolic flow.  BPP 8/8.  Twin B: Maternal right, breech presentation, posterior  placenta.  Amniotic fluid is normal and good fetal activity  seen.  Estimated fetal weight is at the 1st percentile.  Abdominal circumference measurement is at the 2nd  percentile.  Head circumference measurement is at -1 SD  (normal).  Antenatal testing is reassuring.  Umbilical artery Doppler  showed normal forward diastolic flow.  BPP 8/8.  xxxxxxxxxxxxxxxxxxxxxxxxxxxxxxxxxxxxxxxxxxxxxxxx  Consultation (see EPIC )  I had the pleasure of seeing Ms. Dena today at the [facility]. She is G1 P0 at 31w 2d gestation  with dichorionic-diamniotic twin pregnancy. She was  accompanied by her mother.  On ultrasound performed 3 weeks ago, the estimated fetal  weight of twin A was at the 25th percentile and that of twin B  was at the 11th percentile. Marginal cord insertion was seen  in twin B.  2 days ago, she was admitted  to [facility] specialty care after  evaluation at the MAU that confirmed preterm labor and  possible preterm premature rupture of membranes.  AmniSure was positive and the patient had leakage of  amniotic fluid.  The cervix was 5 cm dilated. She received a  course of betamethasone.  On the same day she was  admitted, she signed out [place]. She reports she takes oral  antibiotics.  Patient does not have uterine contractions but reports mild  leakage of amniotic fluid.  She does not have fever or vaginal  bleeding.  Her blood pressure today at her office is 127/74 mmHg.  She does not have gestational diabetes.  I counseled the patient on the following:  Twin pregnancy with PPROM and preterm labor  I explained the diagnosis of PPROM made by her provider  based on her symptoms and positive amnisure test. Patient  still has leakage of amniotic fluid.  PPROM can be associated with complications including but  not limited to intraamniotic and fetal infections, cord prolapse  and placental abruption.  Inpatient management is recommended till delivery.  I counseled the patient that we will recommend delivery at 34  weeks' gestation  I explained the significance of cervical dilation of 5 cm and  that it can lead to quick delivery usually at home. A growth-  restricted premature infant may require prompt respiratory  support that is not available at home. Failure to resuscitate  may lead to severe neurological morbidities.  I strongly emphasized the importance of inpatient  management and advised her to return to the hospital. Her  mother informed that she is in favor of our patient being  admitted to the hospital.  I explained that fetal growth restriction is more-likely from  placental insufficiency and that weekly ultrasound for BPP  and UA Dopplers are recommended. ---------------------------------------------------------------------- Recommendations  -Advised to get admitted.  -Delivery at home is possible if patient is not  admitted. I  discussed  measures with patient's mother including cleaning  the baby/babies and keeping them warm before coming to  the hospital.  -Patient will discuss at home and decide.  -We made an appointment for her to return next week for  BPP and UA Doppler studies. ----------------------------------------------------------------------                  Tama High, MD Electronically Signed Final Report   10/08/2021 03:07 pm ----------------------------------------------------------------------   MAU Course/MDM: Orders Placed This Encounter  Procedures   CBC   RPR   Urine rapid drug screen (hosp performed)not at [place] clear liquid Room service appropriate? Yes; Fluid consistency: Thin   Vitals signs per unit policy   Notify physician (specify)   Fetal monitoring per unit policy   Activity as tolerated   Cervical Exam   Measure blood pressure post delivery every 15 min x 1 hour then every 30 min x 1 hour   Fundal check post delivery every 15 min x 1 hour then every 30 min x 1 hour   If Rapid HIV test positive or known HIV positive: initiate AZT orders   May in and out cath x 2 for inability to void   Insert urethral catheter X 1 PRN If Coude Catheter is chosen, qualified resources by campus can be found in the clinical skills nursing procedure for Coude Catheter 1. If straight catheterized > 2 times or patient unable to void post epidural plac...   Refer to Sidebar Report Urinary (Foley) Catheter Indications   Refer to Sidebar Report Post Indwelling Urinary Catheter Removal and Intervention Guidelines   Discontinue foley prior to vaginal delivery   Initiate Carrier Fluid Protocol   Initiate Oral Care Protocol   Notify physician (specify)   Full code   Type and screen [place] and maintain IV Line   Admit to Inpatient (patient's expected length of stay will be greater than 2 midnights or inpatient only procedure)   Admit to Inpatient (patient's  expected length of stay will be greater than 2 midnights or inpatient only procedure)     Pt feeling  urge to push and on exam, fetal head of Baby A is low station and cervix 9-10 cm. Exam technically difficult due to pt discomfort and movement.    Pt transferred to L&D and Dr Rip Harbour and Maryelizabeth Kaufmann, CNM, notified.  CNM to assist with transfer of pt from MAU to L&D.  Pt stable at time of transfer and L&D team in room to receive pt.  NICU team arrived.  Dr Rip Harbour at bedside as well. Baby B's position confirmed as transverse/oblique with head to maternal right prior to delivery of Baby A.  See delivery summary for additional details.   Fatima Blank Certified Nurse-Midwife 10/11/2021 5:18 AM

## 2021-10-11 NOTE — H&P (Signed)
[facility] DELIVERY ADMISSION HISTORY AND PHYSICAL NOTE  Chelsea Ellis is a 21 y.o. female G1P0000 with IUP at [redacted]w[redacted]d with di-di twins presenting for preterm labor and increased leaking of fluid beginning around 0230 today.This occurs in the setting of previously diagnosed PPROM on 10/01/2021. She was also found to be 5cm at that time. She reports positive fetal movement. She denies vaginal bleeding. She plans on breast and bottle feeding. Her contraception plan is: Depo Provera.  Prenatal History/Complications: PNC at [facility] [place]:  @[redacted]w[redacted]d , CWD, normal anatomy, vertex/breech, presentation, posterior placenta, Baby A 6% , EFW 1459g, Baby B 1.2%, EFW 1289  Pregnancy complications:  - PPROM - Di/di twin gestation - Iron deficiency anemia - Carrier for SMA  Past Medical History: Past Medical History:  Diagnosis Date   ADHD    Anxiety    Depression    'more irritated with people", not wanting to hurt self or anybody 10/01/21   Frequent UTI    Headache(784.0)    Iron deficiency anemia    Migraine     Past Surgical History: Past Surgical History:  Procedure Laterality Date   NO PAST SURGERIES      Obstetrical History: OB History     Gravida  1   Para  0   Term  0   Preterm  0   AB  0   Living  0      SAB  0   IAB  0   Ectopic  0   Multiple  0   Live Births  0           Social History: Social History   Socioeconomic History   Marital status: Single    Spouse name: Not on file   Number of children: Not on file   Years of education: Not on file   Highest education level: Not on file  Occupational History   Not on file  Tobacco Use   Smoking status: Former    Types: Cigarettes    Quit date: 07/15/2021    Years since quitting: 0.2   Smokeless tobacco: Never   Tobacco comments:    Also vape  Vaping Use   Vaping Use: Former   Quit date: 06/26/2021   Substances: THC, CBD, Flavoring  Substance and Sexual Activity   Alcohol use: No   Drug use:  No   Sexual activity: Not Currently    Birth control/protection: Condom    Comment: 1st intercourse 21 yo-More than 5 partners  Other Topics Concern   Not on file  Social History Narrative   Gradulated 13/05/2021, working now    Enjoys Investment banker, operational History.   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    Enjoys listening to music, reading, spending time with friends.   Social Determinants of Health   Financial Resource Strain: Not on file  Food Insecurity: No Food Insecurity   Worried About Insurance underwriter in the Last Year: Never true   Ran Out of Food in the Last Year: Never true  Transportation Needs: No Transportation Needs   Lack of Transportation (Medical): No   Lack of Transportation (Non-Medical): No  Physical Activity: Not on file  Stress: Not on file  Social Connections: Not on file    Family History: Family History  Problem Relation Age of Onset   Migraines Mother    Seizures Mother    Diabetes Father    Hypertension Father    Migraines Maternal Aunt  Migraines Maternal Uncle    Cancer Maternal Grandmother        Died at 64-Cervical   Migraines Maternal Grandmother    Migraines Maternal Grandfather     Allergies: Allergies  Allergen Reactions   Elemental Sulfur Hives   Sulfamethoxazole Hives   Pineapple Rash    Medications Prior to Admission  Medication Sig Dispense Refill Last Dose   amoxicillin (AMOXIL) 500 MG capsule Take 1 capsule (500 mg total) by mouth 3 (three) times daily. 21 capsule 2 Past Month   ASPIRIN 81 PO Take by mouth.   10/11/2021   docusate sodium (COLACE) 100 MG capsule Take 100 mg by mouth daily.   Past Month   ferrous sulfate (FERROUSUL) 325 (65 FE) MG tablet Take 1 tablet (325 mg total) by mouth every other day. 30 tablet 3 10/11/2021   Prenatal Vit-Fe Fumarate-FA (PRENATAL MULTIVITAMIN) TABS tablet Take 1 tablet by mouth daily at 12 noon.   10/11/2021     Review of Systems  All systems reviewed and  negative except as stated in HPI  Physical Exam Last menstrual period 02/16/2021. General appearance: alert, oriented, NAD Lungs: normal respiratory effort Heart: regular rate Abdomen: soft, non-tender; gravid, leopolds  Extremities: No calf swelling or tenderness Presentation: cephalic by digital exam, confirmed with BSUS  Fetal monitoring Baby A: 130s, Baby B: 140s with handheld EFM Uterine activity: q 2-3 min  Dilation: 10 Effacement (%): 100 Exam by:: Sharen Counter, CNM  Prenatal labs: ABO, Rh: --/--/O POS (02/15 1400) Antibody: NEG (02/15 1400) Rubella: Immune (11/21 0000) RPR: NON REACTIVE (02/15 1400)  HBsAg: Negative (11/21 0000)  HIV: Non Reactive (01/11 0834)  GC/Chlamydia: Neg  GBS: Negative (10/01/2021)  2-hr GTT: WNL Genetic screening:  Carrier for SMA Anatomy US: WNL  Prenatal Transfer Tool  Maternal Diabetes: No Genetic Screening: Normal Maternal Ultrasounds/Referrals: Normal Fetal Ultrasounds or other Referrals:  None Maternal Substance Abuse:  UDS collected on admission Significant Maternal Medications:  None Significant Maternal Lab Results: Group B Strep negative  No results found for this or any previous visit (from the past 24 hour(s)).  Patient Active Problem List   Diagnosis Date Noted   Preterm delivery 10/11/2021   Preterm labor 10/01/2021   Preterm premature rupture of membranes (PPROM) in third trimester, antepartum 10/01/2021   UTI in pregnancy, antepartum, third trimester 10/01/2021   Anemia of pregnancy in third trimester 08/28/2021   Supervision of high risk pregnancy, antepartum 07/25/2021   Abnormal findings on prenatal screening 07/25/2021   Twin pregnancy, twins dichorionic and diamniotic 07/25/2021   ADHD 07/25/2021   Dyshidrotic eczema 09/22/2018   Left against medical advice 10/30/2016   IBS (irritable bowel syndrome) 03/05/2016   Anxiety and depression 12/11/2015   Frequent headaches 12/11/2015    Assessment: -  Chelsea Ellis is a 21 y.o. G1P0000 at [redacted]w[redacted]d with di/di twins - Patient arrived to MAU with Baby A 8-9cm/100/+2 - Dr. Alysia Penna and NICU team present for delivery - GBS Neg - Vertex/vertex confirmed with BSUS - COVID: swab pending-  - Imminent vaginal birth   Calvert Cantor, CNM 10/11/2021, 5:28 AM

## 2021-10-11 NOTE — Discharge Instructions (Signed)

## 2021-10-11 NOTE — MAU Note (Signed)
Pt reports to MAU via EMS for contractions that are coming every minute.  Pt states that her water broke.  Observed clear fluid.  Sharen Counter, CNM at bedside.   Cervical exam completed by Misty Stanley, CNM- 10 cm with a rim.  L&D charge nurse given report and was advised for pt to come to room 217.  With Sharen Counter, CNM at bedside taken to L&D for delivery via stretcher.

## 2021-10-12 MED ORDER — IBUPROFEN 600 MG PO TABS: 600.0000 mg | ORAL_TABLET | Freq: Four times a day (QID) | ORAL | 0 refills | Status: AC | PRN

## 2021-10-12 MED ORDER — MEDROXYPROGESTERONE ACETATE 150 MG/ML IM SUSP: 150.0000 mg | Freq: Once | INTRAMUSCULAR | Status: DC

## 2021-10-12 MED ORDER — ACETAMINOPHEN 500 MG PO TABS: 1000.0000 mg | ORAL_TABLET | Freq: Three times a day (TID) | ORAL | 0 refills | Status: AC | PRN

## 2021-10-12 NOTE — Lactation Note (Signed)
This note was copied from a baby's chart. Lactation Consultation Note  Patient Name: Chelsea Ellis JOINO'M Date: 10/12/2021   [name] was visited by lactation on 2/25 in the NICU. I attempted to follow up in the NICU on 2/26. The RN updated me that maternal UDS returned positive (2/25) for substances that are contraindicated with breastfeeding.   The RN on OB Raynelle Fanning) asked [name] this morning if she would like to pump, and [name] indicated that she was not going to pump. Lactation will follow up should [name] wish to provide breast milk, in accordance with the pediatric provider's recommendations.   Age:41 hours   [name] 10/12/2021, 11:45 AM

## 2021-10-12 NOTE — Progress Notes (Signed)
Post Partum Day 1 twin delivery at [redacted]w[redacted]d Subjective: no complaints, up ad lib, voiding, tolerating PO, and + flatus  Objective: Blood pressure 110/72, pulse 62, temperature 98.3 F (36.8 C), temperature source Oral, resp. rate 15, height 5\' 7"  (1.702 m), weight 79.6 kg, last menstrual period 02/16/2021, SpO2 100 %, unknown if currently breastfeeding.  Physical Exam:  General: alert, cooperative, and no distress Lochia: appropriate Uterine Fundus: firm Incision:  DVT Evaluation: No evidence of DVT seen on physical exam.  Recent Labs    10/11/21 0534  HGB 9.8*  HCT 27.3*    Assessment/Plan: Plan for discharge tomorrow, Breastfeeding, Social Work consult, and Contraception Depo Provera to be given today   LOS: 1 day   Chelsea Ellis 10/12/2021, 7:22 AM

## 2021-10-12 NOTE — Clinical Social Work Maternal (Signed)
CLINICAL SOCIAL WORK MATERNAL/CHILD NOTE  Patient Details  Name: Chelsea Ellis MRN: [id number] Date of Birth: [birth year]  Date:  10/12/2021  Clinical Social Worker Initiating Note:  Chelsea Ellis Date/Time: Initiated:  10/12/21/1148     Child's Name:  Chelsea Ellis and Chelsea Ellis   Biological Parents:  Mother, Father (FOB is Chelsea Ellis (7yr old) [phone]   Need for Interpreter:      Reason for Referral:  Behavioral Health Concerns, Parental Support of Premature Babies < 354weeks/or Critically Ill babies, Current Substance Use/Substance Use During Pregnancy     Address:  [address]   Phone number:  [phone](home)     Additional phone number: FOB's number is [phone] Household Members/Support Persons (HM/SP):   Household Member/Support Person 1, Household Member/Support Person 2 ([name] reported that she reside with her mother and father.)   HM/SP Name Relationship DOB or Age HM/SP -1       HM/SP -2       HM/SP -3       HM/SP -4       HM/SP -5       HM/SP -6       HM/SP -7       HM/SP -8         Natural Supports (not living in the home):  Extended Family, Immediate Family, Spouse/significant other (Per [name], FOB's family will also provide supports if needed.)   Professional Supports: None   Employment: Unemployed   Type of Work:     Education:  HProgrammer, systems  Homebound arranged:    FMuseum/gallery curatorResources:  MKohl's  Other Resources:  WIC (CSW provided [name] with information to apply for [employer])   Cultural/Religious Considerations Which May Impact Care:  Per MW.W. [employer] Sheet, [name] is Non-Denominational  Strengths:  Ability to meet basic needs  , Understanding of illness, Home prepared for child   (Local Peds list provided to [name].)   Psychotropic Medications:         Pediatrician:       Pediatrician List: Peds list provided.   [facility]    Pediatrician Fax Number:    Risk Factors/Current Problems:  Substance Use  , Mental Health Concerns     Cognitive State:  Alert  , Able to Concentrate  , Insightful  , Linear Thinking     Mood/Affect:  Comfortable  , Interested  , Happy  , Relaxed     CSW Assessment: CSW met with [name] in room 105 to complete an assessment for substance use, Edinburgh Score of 15, and MH hx.  When CSW arrived, [name] was resting in bed (twins are in the NICU). CSW explained CSW's role and [name] was receptive to meeting with CSW however she requested that her mother be present.  [facility] [name] gave CSW verbal permission to discuss anything including [name]'s MH and substance use while [name]'s mother was present. [name]'s mother returned to the room and appears to be a support to [name].  [name] appeared polite, forthcoming, and easy to engage. When CSW asked about [name]'s substance use [name] became appropriately tearful.    CSW asked about [name]'s MH hx.  [name] openly shared that she dx with anx/dep at age 89  Per [name], she an established patient with Chelsea Ellis online counseling.  [name] communicated that is not currently taking any medications.  CSW provided education regarding the baby blues period vs. perinatal mood disorders, discussed treatment and gave resources for mental health follow up if concerns arise.  CSW recommends self-evaluation during the postpartum time period using the New Mom Checklist from Postpartum Progress and encouraged [name] to contact a medical professional if symptoms are noted at any time. [name] presented with insight and awareness and did not demonstrate any acute MH symptoms.  [name] reported feeling comfortable seeking help if needed and she communicated having a good support team.    CSW assessed for safety and [name] denied SI, HI, and DV. CSW also reviewed [name]'s Edinburgh results and [name] continue to deny safety concerns.    CSW asked about [name]'s substance  use hx.  [name] reported the use of marijuana, (2-3 weeks ago) cocaine (2-3 days ago), and Benzodiazepines (Xanax 2-3 days ago).   [name] tearfully shared that she did not have a reason for using substances, however she does not feel like she has a substance use problem. CSW offered resources for substance use and [name] declined. [name] also reported that FOB does not use illicit substance and he is not aware of [name]'s substance use. CSW made [name] aware of hospital's drug screen policy and [name] was understanding.  CSW communicated that TwinA UDS is positive for [facility] and CSW will be making a report to [place] (CPS report made to intake worker Chelsea Ellis). CSW will continue to monitor Twins CDS and will report results to assigned CPS worker. Per CPS there are barriers to twins discharging to [facility]. when they are medically ready.    [name] reported having all essential items to care for twins including new car seats and  bassinets.    CSW provided review of Sudden Infant Death Syndrome (SIDS) precautions.    CSW reviewed NICU visitation and [name] denied having any questions or concerns.  [name] reported feeling well informed about Twins health and communicated that she plans to visit with them daily.   CSW will continue to offer resources and supports to family while twins remains in NICU.    CSW Plan/Description:  Psychosocial Support and Ongoing Assessment of Needs, Sudden Infant Death Syndrome (SIDS) Education, Perinatal Mood and Anxiety Disorder (PMADs) Education, Other Patient/Family Education, [place] [facility], Other Information/Referral to [employer], Child Protective Service Report     Chelsea Ellis, MSW, LCSW Clinical Social Work [phone]

## 2021-10-13 ENCOUNTER — Other Ambulatory Visit: Payer: Self-pay | Admitting: Obstetrics and Gynecology

## 2021-10-13 ENCOUNTER — Other Ambulatory Visit: Payer: Self-pay

## 2021-10-13 ENCOUNTER — Encounter: Payer: Self-pay | Admitting: Obstetrics and Gynecology

## 2021-10-13 ENCOUNTER — Ambulatory Visit: Payer: BC Managed Care – PPO

## 2021-10-13 ENCOUNTER — Ambulatory Visit: Payer: Self-pay

## 2021-10-13 DIAGNOSIS — O42113 Preterm premature rupture of membranes, onset of labor more than 24 hours following rupture, third trimester: Secondary | ICD-10-CM

## 2021-10-13 DIAGNOSIS — O30043 Twin pregnancy, dichorionic/diamniotic, third trimester: Secondary | ICD-10-CM

## 2021-10-13 NOTE — Progress Notes (Signed)
Patient was assessed and managed by nursing staff during this encounter. I have reviewed the chart and agree with the documentation and plan. I have also made any necessary editorial changes. Nst reviewed and reactive x 2 with 10 x 10 accels, no decels  Catalina Antigua, MD 10/13/2021 12:29 PM

## 2021-10-14 ENCOUNTER — Ambulatory Visit: Payer: Self-pay

## 2021-10-14 LAB — SURGICAL PATHOLOGY

## 2021-10-15 ENCOUNTER — Ambulatory Visit: Payer: Self-pay

## 2021-10-15 NOTE — Lactation Note (Signed)
This note was copied from a baby's chart. ? ?NICU Lactation Consultation Note ? ?Patient Name: Chelsea Ellis ?Today's Date: 10/15/2021 ?Age:21 years ? ? ?Subjective ?Reason for consult: Follow-up assessment ?Mother has experienced + breast changes and onset of lactogenesis II. She is pumping at home and saving her milk. Mother is aware that maternal milk is contraindicated at this time. I provided her with a hand pump to relieve engorgement. I counseled on the need to continue discarding milk at this time.  ? ?Objective ?Infant data: ?Mother's Current Feeding Choice: -- (donor breast milk. Maternal milk contraindicated at this time.) ? ?  ?Maternal data: ?[id number]  ?Vaginal, Spontaneous ? ?Pump: Personal (willow) ? ? ?Intervention/Plan ?Interventions: Education; Hand pump ? ?Plan: ?Consult Status: Follow-up ? ?NICU Follow-up type: Verify absence of engorgement ? ?Mother to remove milk prn to avoid engorgement and sustain milk supply, according to her informed choice. Mother to discuss future milk safety with MD.  ? ?Elder Negus ?10/15/2021, 2:16 PM ?

## 2021-10-16 ENCOUNTER — Other Ambulatory Visit: Payer: Self-pay

## 2021-10-16 ENCOUNTER — Other Ambulatory Visit: Payer: Self-pay | Admitting: Obstetrics & Gynecology

## 2021-10-16 DIAGNOSIS — F1410 Cocaine abuse, uncomplicated: Secondary | ICD-10-CM | POA: Insufficient documentation

## 2021-10-16 DIAGNOSIS — F1290 Cannabis use, unspecified, uncomplicated: Secondary | ICD-10-CM

## 2021-10-16 DIAGNOSIS — F1310 Sedative, hypnotic or anxiolytic abuse, uncomplicated: Secondary | ICD-10-CM | POA: Insufficient documentation

## 2021-10-16 NOTE — Progress Notes (Signed)
Received call from Dr. Tobin Chad (Neonatologist) taking care of Chelsea Ellis's babies. Patient wants to give them breast milk but their policy given her history of substance abuse (10/11/21 UDS was positive for cocaine, benzodiazepines and THC) is to do a urine drug screen.  Patient will be told to go to office to given sample for testing. Order placed for patient. ? ? ?Chelsea Collins, MD, FACOG ?Obstetrician Heritage manager, Faculty Practice ?Center for [employer], [facility] ? ?

## 2021-10-16 NOTE — BH Specialist Note (Signed)
Integrated Behavioral Health via Telemedicine Visit ? ?10/24/2021 ?Chelsea Ellis ?[id number] ? ?Number of Integrated Behavioral Health Clinician visits: 1- Initial Visit ? ?Session Start time: [phone] ?  ?Session End time: [id number] ? ?Total time in minutes: 22 ? ? ?Referring Provider: Nettie Elm, MD ?Patient/Family location: Home ?[facility] Provider location: Center for [employer] at [facility] ? ?All persons participating in visit: Patient Chelsea Ellis and [facility] Patty Leitzke  ? ?Types of Service: Individual psychotherapy and Video visit ? ?I connected with Chelsea Ellis and/or Chelsea Carrow Spackman's  n/a  via  Telephone or [employer]  (Video is Surveyor, mining) and verified that I am speaking with the correct person using two identifiers. Discussed confidentiality: Yes  ? ?I discussed the limitations of telemedicine and the availability of in person appointments.  Discussed there is a possibility of technology failure and discussed alternative modes of communication if that failure occurs. ? ?I discussed that engaging in this telemedicine visit, they consent to the provision of behavioral healthcare and the services will be billed under their insurance. ? ?Patient and/or legal guardian expressed understanding and consented to Telemedicine visit: Yes  ? ?Presenting Concerns: ?Patient and/or family reports the following symptoms/concerns: Stress of babies in NICU; thankful to see them daily; pt is sleeping and eating well, has good support from parents and FOB, and has no concerns at this time.  ?Duration of problem: Postpartum; Severity of problem: mild ? ?Patient and/or Family's Strengths/Protective Factors: ?Social connections, Concrete supports in place (healthy food, safe environments, etc.), Sense of purpose, and Physical Health (exercise, healthy diet, medication compliance, etc.) ? ?Goals Addressed: ?Patient will: ? Reduce symptoms of: stress  ?  Increase knowledge and/or ability of: healthy habits  ? Demonstrate ability to: Increase healthy adjustment to current life circumstances and Increase adequate support systems for patient/family ? ?Progress towards Goals: ?Ongoing ? ?Interventions: ?Interventions utilized:  Psychoeducation and/or Health Education, Link to [employer], and Supportive Reflection ?Standardized Assessments completed: GAD-7 and PHQ 9 ? ?Patient and/or Family Response: Patient agrees with treatment plan. ? ? ?Assessment: ?Patient currently experiencing Psychosocial stress and Other specified counseling.  ? ?Patient may benefit from psychoeducation and brief therapeutic interventions regarding coping with symptoms of current life stress ?. ? ?Plan: ?Follow up with behavioral health clinician on : Call Amyrah Pinkhasov at [phone], as needed ?Behavioral recommendations:  ?-Continue prioritizing healthy self-care via eating regular meals, healthy sleep, and accepting practical support from supportive friends and family ?-Continue taking iron pills as prescribed by medical provider ?-Consider new mom support group of choice at either www.postpartum.net or www.conehealthybaby.com  ?Referral(s): Integrated Art gallery manager (In Clinic) and [employer]:  childcare and new mom support ? ?I discussed the assessment and treatment plan with the patient and/or parent/guardian. They were provided an opportunity to ask questions and all were answered. They agreed with the plan and demonstrated an understanding of the instructions. ?  ?They were advised to call back or seek an in-person evaluation if the symptoms worsen or if the condition fails to improve as anticipated. ? ?Rae Lips, LCSW ? ?Depression screen [facility] 2/9 10/24/2021 10/06/2021 07/28/2021 05/28/2020 10/07/2017  ?Decreased Interest 0 2 1 3  0  ?Down, Depressed, Hopeless 0 3 0 1 0  ?PHQ - 2 Score 0 5 1 4  0  ?Altered sleeping 0 2 1 3  -  ?Tired, decreased energy 1 2 0 3 -   ?Change in appetite 0 0 0 3 -  ?Feeling  bad or failure about yourself  0 1 0 0 -  ?Trouble concentrating 0 0 0 1 -  ?Moving slowly or fidgety/restless 0 0 0 0 -  ?Suicidal thoughts 0 0 0 0 -  ?PHQ-9 Score 1 10 2 14  -  ?Difficult doing work/chores - Not difficult at all - Extremely dIfficult -  ? ?GAD 7 : Generalized Anxiety Score 10/24/2021 10/06/2021 07/28/2021 11/04/2017  ?Nervous, Anxious, on Edge 0 3 0 2  ?Control/stop worrying 0 3 0 1  ?Worry too much - different things 1 3 0 2  ?Trouble relaxing 0 3 0 1  ?Restless 0 0 0 1  ?Easily annoyed or irritable 0 3 1 3   ?Afraid - awful might happen 0 1 0 2  ?Total GAD 7 Score 1 16 1 12   ?Anxiety Difficulty - Not difficult at all - Not difficult at all  ? ? ? ? ?

## 2021-10-18 ENCOUNTER — Ambulatory Visit: Payer: Self-pay

## 2021-10-18 NOTE — Lactation Note (Signed)
This note was copied from a baby's chart. ?Lactation Consultation Note ? ?Patient Name: Chelsea Ellis ?Today's Date: 10/18/2021 ?Reason for consult: Follow-up assessment;NICU baby;Multiple gestation;Infant < 6lbs;Preterm <34wks;Primapara;1st time breastfeeding ?Age:23 days ? ?Visited with mom of 74 days old pre-term NICU twins, she reports that her breast felt full and painful about a couple of days ago but that the pain and discomfort have already subsided. She's only using a hand pump at this point, she's pumping at her own pace, she's aware we can't provide her milk to the babies until she she provides with a (-) drug screen.  ? ?The last time she pumped was 2 days ago, let mom know that regardless of her pumping status we'll continue providing human milk for her babies for as long as they need it; and that if she decides to stop pumping we'll be supportive of her choice. Counsel on breastfeeding and substance use, mom voiced that she has discussed a faith based program for rehab with a Child psychotherapist. ? ?Maternal Data ? Mom's supply is BNL probably due to inconsistent pumping  ? ?Feeding ?Mother's Current Feeding Choice:  (donor milk, mother's milk contraindicated until she provides with a (-) drug screen) ? ?Lactation Tools Discussed/Used ?Tools: Pump ?Breast pump type: Manual ?Pump Education: Setup, frequency, and cleaning ?Reason for Pumping: mother's choice, mom's milk contraindicated until she provides with a (-) drug screen ?Pumping frequency: < once/24 hours ?Pumped volume:  (drops) ? ?Interventions ?Interventions: Breast feeding basics reviewed;Hand pump;Education ? ?Plan of care ?Encouraged mom to continue pumping at her own pace if that's within her goals, she understands though that the ideal pumping schedule for exclusive breastmilk feeding is 8 times/24 hours.  ?She has an appt with her provider on Monday 10/20/21 for a UDS testing; will check for results under her chart ? ?GOB (maternal) present  with mom's permission. All questions and concerns answered, mom to call NICU LC services PRN. ? ?Discharge ?Discharge Education: Engorgement and breast care ?Pump: DEBP;Personal [facility]) ? ?Consult Status ?Consult Status: Follow-up ?Date: 10/18/21 ?Follow-up type: In-patient ? ? ?Chelsea Ellis ?10/18/2021, 3:47 PM ? ? ? ?

## 2021-10-20 ENCOUNTER — Other Ambulatory Visit: Payer: Self-pay

## 2021-10-20 ENCOUNTER — Other Ambulatory Visit: Payer: BC Managed Care – PPO

## 2021-10-20 ENCOUNTER — Encounter: Payer: Self-pay | Admitting: Obstetrics & Gynecology

## 2021-10-20 DIAGNOSIS — F1290 Cannabis use, unspecified, uncomplicated: Secondary | ICD-10-CM | POA: Diagnosis not present

## 2021-10-20 DIAGNOSIS — F1310 Sedative, hypnotic or anxiolytic abuse, uncomplicated: Secondary | ICD-10-CM | POA: Diagnosis not present

## 2021-10-20 DIAGNOSIS — F1410 Cocaine abuse, uncomplicated: Secondary | ICD-10-CM | POA: Diagnosis not present

## 2021-10-22 ENCOUNTER — Telehealth (HOSPITAL_COMMUNITY): Payer: Self-pay | Admitting: *Deleted

## 2021-10-22 NOTE — Telephone Encounter (Signed)
Mom reports feeling good. No concerns about herself at this time. EPDS= 7 (hospital score= 14 per patient-not recorded) Reports feeling much less stress than when she was in the hospital. Babies are still in NICU. ? ?Odis Hollingshead, RN  10-22-2021 at 12:24pm ?

## 2021-10-23 ENCOUNTER — Other Ambulatory Visit: Payer: Self-pay

## 2021-10-24 ENCOUNTER — Ambulatory Visit (INDEPENDENT_AMBULATORY_CARE_PROVIDER_SITE_OTHER): Payer: BC Managed Care – PPO | Admitting: Clinical

## 2021-10-24 DIAGNOSIS — Z7189 Other specified counseling: Secondary | ICD-10-CM

## 2021-10-24 DIAGNOSIS — Z658 Other specified problems related to psychosocial circumstances: Secondary | ICD-10-CM

## 2021-10-24 NOTE — Patient Instructions (Signed)
Center for [employer] [facility] at [facility] ?[address] ?[place], [place] [address] ?[phone] (main office) ?[phone] (Fraida Veldman's office) ? ?New parent support groups:  ?www.conehealthybaby.com ?www.postpartum.net  ? ?[place] [employer]  ?(Childcare options, Early childcare development, etc.) ?www.[employer].tn ? ?

## 2021-10-25 ENCOUNTER — Ambulatory Visit: Payer: Self-pay

## 2021-10-25 LAB — URINE DRUGS OF ABUSE SCREEN W ALC, ROUTINE (REF LAB)
Amphetamines, Urine: NEGATIVE ng/mL
Barbiturate Quant, Ur: NEGATIVE ng/mL
Benzodiazepine Quant, Ur: NEGATIVE ng/mL
Cocaine (Metab.): NEGATIVE ng/mL
Ethanol, Urine: NEGATIVE %
Methadone Screen, Urine: NEGATIVE ng/mL
Opiate Quant, Ur: NEGATIVE ng/mL
PCP Quant, Ur: NEGATIVE ng/mL
Propoxyphene: NEGATIVE ng/mL

## 2021-10-25 LAB — PANEL 799049
CARBOXY THC GC/MS CONF: 56 ng/mL
Cannabinoid GC/MS, Ur: POSITIVE — AB

## 2021-10-25 NOTE — Lactation Note (Signed)
This note was copied from a baby's chart. ?Lactation Consultation Note ?Mother has discontinued pumping. Milk production has ceased. Lactation services are complete. ? ?Patient Name: Chelsea Ellis ?Today's Date: 10/25/2021 ?  ?Age:23 wk.o. ? ? ?Chelsea Ellis ?10/25/2021, 2:59 PM ? ? ? ?

## 2021-10-27 ENCOUNTER — Encounter: Payer: Self-pay | Admitting: Obstetrics & Gynecology

## 2021-10-27 ENCOUNTER — Other Ambulatory Visit: Payer: Self-pay

## 2021-10-30 ENCOUNTER — Other Ambulatory Visit: Payer: Self-pay

## 2021-11-03 ENCOUNTER — Other Ambulatory Visit: Payer: Self-pay

## 2021-11-03 ENCOUNTER — Encounter: Payer: Self-pay | Admitting: Obstetrics and Gynecology

## 2021-11-06 ENCOUNTER — Other Ambulatory Visit: Payer: Self-pay

## 2021-11-10 ENCOUNTER — Other Ambulatory Visit: Payer: Self-pay

## 2021-11-10 ENCOUNTER — Encounter: Payer: Self-pay | Admitting: Family Medicine

## 2021-11-13 ENCOUNTER — Ambulatory Visit (INDEPENDENT_AMBULATORY_CARE_PROVIDER_SITE_OTHER): Payer: BC Managed Care – PPO | Admitting: Family Medicine

## 2021-11-13 ENCOUNTER — Other Ambulatory Visit: Payer: Self-pay

## 2021-11-13 NOTE — Progress Notes (Signed)
? ? ?Post Partum Visit Note ? ?Chelsea Ellis is a 21 y.o. [id number] female who presents for a postpartum visit. She is 4 weeks postpartum following a normal spontaneous vaginal delivery.  I have fully reviewed the prenatal and intrapartum course. The delivery was at [redacted]w[redacted]d gestational weeks.  Anesthesia: none. Postpartum course has been normal. Baby is doing well. Baby is feeding by bottle - Similac . Bleeding thin lochia. Bowel function is normal. Bladder function is normal. Patient is not sexually active. Contraception method is Depo-Provera injections. Postpartum depression screening: negative. ? ? ?The pregnancy intention screening data noted above was reviewed. Potential methods of contraception were discussed. The patient elected to proceed with No data recorded. ? ? Edinburgh Postnatal Depression Scale - 11/13/21 1543   ? ?  ? Edinburgh Postnatal Depression Scale:  In the Past 7 Days  ? I have been able to laugh and see the funny side of things. 0   ? I have looked forward with enjoyment to things. 0   ? I have blamed myself unnecessarily when things went wrong. 1   ? I have been anxious or worried for no good reason. 1   ? I have felt scared or panicky for no good reason. 0   ? Things have been getting on top of me. 0   ? I have been so unhappy that I have had difficulty sleeping. 0   ? I have felt sad or miserable. 1   ? I have been so unhappy that I have been crying. 1   ? The thought of harming myself has occurred to me. 0   ? Edinburgh Postnatal Depression Scale Total 4   ? ?  ?  ? ?  ? ? ?There are no preventive care reminders to display for this patient. ? ?The following portions of the patient's history were reviewed and updated as appropriate: allergies, current medications, past family history, past medical history, past social history, past surgical history, and problem list. ? ?Review of Systems ?Pertinent items noted in HPI and remainder of comprehensive ROS otherwise negative. ? ?Objective:   ?BP 129/90   Pulse 70   Wt 158 lb (71.7 kg)   LMP 02/16/2021   Breastfeeding No   BMI 24.75 kg/m?   ? ?General:  alert, cooperative, and appears stated age  ?Lungs: Normal effort  ?Heart:  regular rate and rhythm  ?Abdomen: soft, non-tender; bowel sounds normal; no masses,  no organomegaly   ?     ?Assessment:  ? ? ?Normal postpartum exam.  ? ?Plan:  ? ?Essential components of care per ACOG recommendations: ? ?1.  Mood and well being: Patient with negative depression screening today. Reviewed local resources for support.  ?- Patient tobacco use? Yes  ?- hx of drug use? Yes  ? ?2. Infant care and feeding:  ?-Patient currently breastmilk feeding? No.  ?-Social determinants of health (SDOH) reviewed in EPIC. Babies remain in NICU--lives with her mom, so has support.  ? ?3. Sexuality, contraception and birth spacing ?- Patient does not want a pregnancy in the next year.  Desired family size is unsure children.  ?- Reviewed reproductive life planning. Reviewed contraceptive methods based on pt preferences and effectiveness.  Patient desired Withdrawal or Other Method today.   ?- Discussed birth spacing of 18 months ? ?4. Sleep and fatigue ?-Encouraged family/partner/community support of 4 hrs of uninterrupted sleep to help with mood and fatigue ? ?5. Physical Recovery  ?- Discussed patients delivery and  complications. She describes her labor as good. ?- Patient had a Vaginal, no problems at delivery. Patient had a  peri-urethral  laceration. Perineal healing reviewed. Patient expressed understanding ?- Patient has urinary incontinence? No. ?- Patient is safe to resume physical and sexual activity ? ?6.  Health Maintenance ?- HM due items addressed Yes ?- Last pap smear 07/07/21 Normal. Pap smear not done at today's visit.  ?-Breast Cancer screening indicated? No.  ? ?7. Chronic Disease/Pregnancy Condition follow up: None ? ?- PCP follow up ? ?Reva Bores, MD ?Center for [facility], [facility]  ?

## 2021-11-14 ENCOUNTER — Encounter: Payer: Self-pay | Admitting: Family Medicine

## 2021-11-17 ENCOUNTER — Encounter: Payer: Self-pay | Admitting: Medical

## 2021-11-17 ENCOUNTER — Other Ambulatory Visit: Payer: Self-pay

## 2021-11-20 ENCOUNTER — Other Ambulatory Visit: Payer: Self-pay

## 2021-11-24 ENCOUNTER — Encounter: Payer: Self-pay | Admitting: Obstetrics and Gynecology

## 2021-11-24 ENCOUNTER — Other Ambulatory Visit: Payer: Self-pay

## 2021-11-27 ENCOUNTER — Other Ambulatory Visit: Payer: Self-pay

## 2021-12-01 ENCOUNTER — Other Ambulatory Visit: Payer: Self-pay

## 2021-12-01 ENCOUNTER — Encounter: Payer: Self-pay | Admitting: Family Medicine

## 2021-12-04 ENCOUNTER — Other Ambulatory Visit: Payer: Self-pay

## 2021-12-08 ENCOUNTER — Other Ambulatory Visit: Payer: Self-pay

## 2021-12-08 ENCOUNTER — Encounter: Payer: Self-pay | Admitting: Obstetrics and Gynecology

## 2021-12-11 ENCOUNTER — Other Ambulatory Visit: Payer: Self-pay

## 2021-12-29 ENCOUNTER — Ambulatory Visit (INDEPENDENT_AMBULATORY_CARE_PROVIDER_SITE_OTHER): Payer: BC Managed Care – PPO | Admitting: *Deleted

## 2021-12-29 VITALS — BP 125/95 | HR 83 | Ht 67.0 in | Wt 172.0 lb

## 2021-12-29 DIAGNOSIS — I10 Essential (primary) hypertension: Secondary | ICD-10-CM

## 2021-12-29 DIAGNOSIS — Z3042 Encounter for surveillance of injectable contraceptive: Secondary | ICD-10-CM | POA: Diagnosis not present

## 2021-12-29 MED ORDER — MEDROXYPROGESTERONE ACETATE 150 MG/ML IM SUSP
150.0000 mg | Freq: Once | INTRAMUSCULAR | Status: AC
  Administered 2021-12-29: 150 mg via INTRAMUSCULAR

## 2021-12-29 MED ORDER — MEDROXYPROGESTERONE ACETATE 150 MG/ML IM SUSP: 150.0000 mg | INTRAMUSCULAR | 0 refills | Status: AC

## 2021-12-29 NOTE — Progress Notes (Signed)
Here for nurse visit for depo-provera . Last exam visit was postpartum 11/13/21. Last depo-provera was in hospital 10/12/21. Tolerated injection without  complaint. BP elevated 138/97, repeat 125/95.  Was advised elevated at postpartum check and to follow up with PCP. She states she hasn't found a PCP yet, just got medicaid. Advised may go to PCP of her choice that accepts medicaid. Referral made to Primary Care at [facility]. Advised she should call them or other of her choice to make appointment asap.  ?She will make next injection appointment at checkout for 03/16/22- 03/30/22. She voices understanding.  ?Nancy Fetter ?

## 2022-03-04 DIAGNOSIS — F419 Anxiety disorder, unspecified: Secondary | ICD-10-CM | POA: Diagnosis not present

## 2022-03-16 ENCOUNTER — Ambulatory Visit (INDEPENDENT_AMBULATORY_CARE_PROVIDER_SITE_OTHER): Payer: BC Managed Care – PPO

## 2022-03-16 ENCOUNTER — Other Ambulatory Visit: Payer: Self-pay

## 2022-03-16 VITALS — BP 122/83 | HR 96 | Wt 200.0 lb

## 2022-03-16 DIAGNOSIS — Z3042 Encounter for surveillance of injectable contraceptive: Secondary | ICD-10-CM | POA: Diagnosis not present

## 2022-03-16 MED ORDER — MEDROXYPROGESTERONE ACETATE 150 MG/ML IM SUSP
150.0000 mg | Freq: Once | INTRAMUSCULAR | Status: AC
  Administered 2022-03-16: 150 mg via INTRAMUSCULAR

## 2022-03-16 NOTE — Progress Notes (Signed)
Chelsea Ellis here for Depo-Provera Injection. Injection administered without complication. Patient will return in 3 months for next injection between October 16th and October 30th. Next annual visit due March 2024.   Cline Crock, RN 03/16/2022

## 2022-03-16 NOTE — Patient Instructions (Signed)
[facility] Address: [address], [place], [place] [address] Phone: [phone] 

## 2022-03-17 ENCOUNTER — Other Ambulatory Visit: Payer: Self-pay | Admitting: Family Medicine

## 2022-03-17 DIAGNOSIS — Z3042 Encounter for surveillance of injectable contraceptive: Secondary | ICD-10-CM

## 2022-06-01 ENCOUNTER — Other Ambulatory Visit: Payer: Self-pay

## 2022-06-01 ENCOUNTER — Ambulatory Visit (INDEPENDENT_AMBULATORY_CARE_PROVIDER_SITE_OTHER): Payer: BC Managed Care – PPO | Admitting: *Deleted

## 2022-06-01 VITALS — BP 126/67 | HR 91 | Ht 67.0 in | Wt 216.0 lb

## 2022-06-01 DIAGNOSIS — Z3042 Encounter for surveillance of injectable contraceptive: Secondary | ICD-10-CM | POA: Diagnosis not present

## 2022-06-01 MED ORDER — MEDROXYPROGESTERONE ACETATE 150 MG/ML IM SUSP
150.0000 mg | Freq: Once | INTRAMUSCULAR | Status: AC
  Administered 2022-06-01: 150 mg via INTRAMUSCULAR

## 2022-06-01 NOTE — Progress Notes (Signed)
Here for depo-provera . Last annual exam was postpartum 11/13/21. Last depo-provera was 03/16/22. Last pap was 07/07/21. Injection given without complaint. Sent to desk to schedule next injection for 08/17/22- 08/31/22.  Staci Acosta

## 2022-08-18 ENCOUNTER — Other Ambulatory Visit: Payer: Self-pay

## 2022-08-18 ENCOUNTER — Ambulatory Visit (INDEPENDENT_AMBULATORY_CARE_PROVIDER_SITE_OTHER): Payer: BC Managed Care – PPO

## 2022-08-18 DIAGNOSIS — Z3042 Encounter for surveillance of injectable contraceptive: Secondary | ICD-10-CM | POA: Diagnosis not present

## 2022-08-18 MED ORDER — MEDROXYPROGESTERONE ACETATE 150 MG/ML IM SUSP
150.0000 mg | Freq: Once | INTRAMUSCULAR | Status: AC
  Administered 2022-08-18: 150 mg via INTRAMUSCULAR

## 2022-08-18 NOTE — Progress Notes (Signed)
Chelsea Ellis here for Depo-Provera Injection. Injection administered without complication. Patient will return in 3 months for next injection between 03/20 and 04/03. Next annual visit due March 2024.   Martina Sinner, RN 08/18/2022  2:04 PM

## 2022-11-04 ENCOUNTER — Ambulatory Visit (INDEPENDENT_AMBULATORY_CARE_PROVIDER_SITE_OTHER): Payer: Medicaid Other

## 2022-11-04 ENCOUNTER — Other Ambulatory Visit: Payer: Self-pay

## 2022-11-04 VITALS — BP 121/81 | HR 115 | Ht 67.0 in | Wt 229.0 lb

## 2022-11-04 DIAGNOSIS — Z3042 Encounter for surveillance of injectable contraceptive: Secondary | ICD-10-CM

## 2022-11-04 MED ORDER — MEDROXYPROGESTERONE ACETATE 150 MG/ML IM SUSP
150.0000 mg | Freq: Once | INTRAMUSCULAR | Status: DC
Start: 2022-11-04 — End: 2022-11-04
  Administered 2022-11-04: 150 mg via INTRAMUSCULAR

## 2022-11-04 NOTE — Progress Notes (Signed)
Chelsea Ellis here for Depo-Provera Injection. Injection administered without complication. Patient will return in 3 months for next injection between June 5 and June 19. Pt encouraged to get annual scheduled today among checking out.   Pt verbalized understanding with no further questions.   Verdell Carmine, RN 11/04/2022  3:42 PM

## 2022-12-10 ENCOUNTER — Ambulatory Visit: Payer: Medicaid Other | Admitting: Family Medicine

## 2023-01-25 ENCOUNTER — Ambulatory Visit: Payer: Medicaid Other

## 2023-01-26 ENCOUNTER — Ambulatory Visit: Payer: Medicaid Other | Admitting: Obstetrics and Gynecology

## 2023-02-01 DIAGNOSIS — Z3009 Encounter for other general counseling and advice on contraception: Secondary | ICD-10-CM | POA: Diagnosis not present

## 2023-02-01 DIAGNOSIS — Z30013 Encounter for initial prescription of injectable contraceptive: Secondary | ICD-10-CM | POA: Diagnosis not present

## 2023-02-04 IMAGING — US US MFM OB FOLLOW-UP
1 series · 1 of 28 positions shown · non-contrast
Comparison: none

[Series 1: us mfm ob follow-up · 1 of 71 slices shown]
[im 37/71]
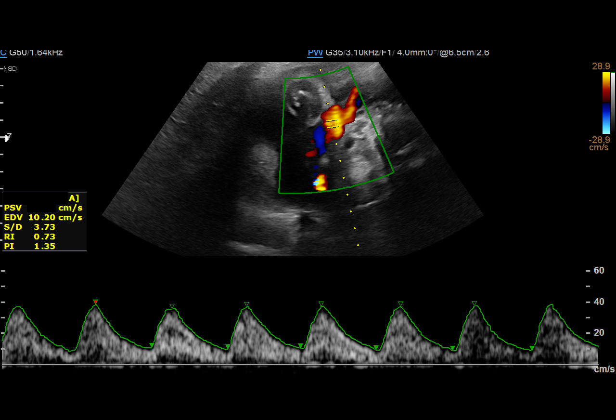

[1 of 28 positions shown; findings below may reference images not displayed]

Addendum:\.br----------------------------------------------------------------------
----------------------------------------------------------------------

----------------------------------------------------------------------

                                                            WOLEY
----------------------------------------------------------------------

    GEST
 4  US MFM UA CORD DOPPLER                76820.02    JHEMBOY PADAM
 5  US MFM UA ADDL GEST                   76820.01    JHEMBOY PADAM
    ADDL GESTATION
----------------------------------------------------------------------

----------------------------------------------------------------------
Indications

 Twin pregnancy, di/di, third trimester
 31 weeks gestation of pregnancy
 Marginal insertion of umbilical cord affecting
 management of mother in third trimester
 (Twin B)
 Echogenic intracardiac focus of the heart
 (EIF) Twin B
 Premature rupture of membranes - leaking
 fluid (2-15)
----------------------------------------------------------------------
Fetal Evaluation (Fetus A)

 Num Of Fetuses:         2
 Fetal Heart Rate(bpm):  128
 Cardiac Activity:       Observed
 Fetal Lie:              Maternal left side
 Presentation:           Cephalic
 Placenta:               Posterior
 P. Cord Insertion:      Previously Visualized
 Amniotic Fluid
 AFI FV:      Within normal limits

                             Largest Pocket(cm)

----------------------------------------------------------------------
Biophysical Evaluation (Fetus A)

 Amniotic F.V:   Within normal limits       F. Tone:        Observed
 F. Movement:    Observed                   Score:          [DATE]
 F. Breathing:   Observed
----------------------------------------------------------------------
Biometry (Fetus A)

 BPD:      78.4  mm     G. Age:  31w 3d         45  %    CI:        75.23   %    70 - 86
                                                         FL/HC:      19.7   %    19.3 -
 HC:      286.7  mm     G. Age:  31w 4d         20  %    HC/AC:      1.14        0.96 -
 AC:      251.7  mm     G. Age:  29w 3d          6  %    FL/BPD:     71.9   %    71 - 87
 FL:       56.4  mm     G. Age:  29w 4d          5  %    FL/AC:      22.4   %    20 - 24
 HUM:      49.5  mm     G. Age:  29w 1d        < 5  %

 Est. FW:    1804  gm      3 lb 3 oz      6  %     FW Discordancy     0 \ 12 %
----------------------------------------------------------------------
OB History

 Gravidity:    1         Term:   0        Prem:   0        SAB:   0
 TOP:          0       Ectopic:  0        Living: 0
----------------------------------------------------------------------
Gestational Age (Fetus A)

 LMP:           33w 3d        Date:  02/16/21                 EDD:   11/23/21
 U/S Today:     30w 4d                                        EDD:   12/13/21
 Best:          31w 2d     Det. By:  U/S Fetus Avg.           EDD:   12/08/21
                                     (07/23/21)
----------------------------------------------------------------------
Anatomy (Fetus A)

 Cranium:               Appears normal         LVOT:                   Previously seen
 Cavum:                 Previously seen        Aortic Arch:            Previously seen
 Ventricles:            Appears normal         Ductal Arch:            Appears normal
 Choroid Plexus:        Previously seen        Diaphragm:              Previously seen
 Cerebellum:            Previously seen        Stomach:                Appears normal, left
                                                                       sided
 Posterior Fossa:       Previously seen        Abdomen:                Previously seen
 Nuchal Fold:           Previously seen        Abdominal Wall:         Previously seen
 Face:                  Orbits and profile     Cord Vessels:           Previously seen
                        previously seen
 Lips:                  Appears normal         Kidneys:                Appear normal
 Palate:                Previously seen        Bladder:                Appears normal
 Thoracic:              Previously seen        Spine:                  Previously seen
 Heart:                 Appears normal         Upper Extremities:      Previously seen
                        (4CH, axis, and
                        situs)
 RVOT:                  Previously seen        Lower Extremities:      Previously seen

 Other:  Heels and 5th digit prev. visualized. Technically difficult due to fetal
         position.
----------------------------------------------------------------------
Doppler - Fetal Vessels (Fetus A)
 Umbilical Artery
  S/D     %tile      RI    %tile      PI    %tile            ADFV    RDFV
  3.76       92    0.73       90    1.31       96               No      No

----------------------------------------------------------------------

Fetal Evaluation (Fetus B)

 Num Of Fetuses:         2
 Fetal Heart Rate(bpm):  150
 Cardiac Activity:       Observed
 Fetal Lie:              Maternal right side
 Presentation:           Breech
 Placenta:               Posterior
 P. Cord Insertion:      Marginal insertion

 Amniotic Fluid
 AFI FV:      Within normal limits

                             Largest Pocket(cm)

----------------------------------------------------------------------
Biophysical Evaluation (Fetus B)

 Amniotic F.V:   Within normal limits       F. Tone:        Observed
 F. Movement:    Observed                   Score:          [DATE]
 F. Breathing:   Observed
----------------------------------------------------------------------
Biometry (Fetus B)

 BPD:      72.2  mm     G. Age:  29w 0d        1.4  %    CI:        70.34   %    70 - 86
                                                         FL/HC:      19.7   %    19.3 -
 HC:      274.5  mm     G. Age:  30w 0d        1.9  %    HC/AC:      1.12        0.96 -
 AC:      244.1  mm     G. Age:  28w 5d        1.5  %    FL/BPD:     75.1   %    71 - 87
 FL:       54.2  mm     G. Age:  28w 5d        < 1  %    FL/AC:      22.2   %    20 - 24
 HUM:        47  mm     G. Age:  27w 5d        < 5  %
 Est. FW:    3829  gm    2 lb 13 oz     1.2  %     FW Discordancy        12  %
----------------------------------------------------------------------
Gestational Age (Fetus B)

 LMP:           33w 3d        Date:  02/16/21                 EDD:   11/23/21
 U/S Today:     29w 1d                                        EDD:   12/23/21
 Best:          31w 2d     Det. By:  U/S Fetus Avg.           EDD:   12/08/21
                                     (07/23/21)
----------------------------------------------------------------------
Anatomy (Fetus B)

 Cranium:               Appears normal         Aortic Arch:            Previously seen
 Cavum:                 Previously seen        Ductal Arch:            Previously seen
 Ventricles:            Appears normal         Diaphragm:              Previously seen
 Choroid Plexus:        Previously seen        Stomach:                Appears normal, left
                                                                       sided
 Cerebellum:            Previously seen        Abdomen:                Previously seen
 Posterior Fossa:       Previously seen        Abdominal Wall:         Previously seen
 Nuchal Fold:           Previously seen        Cord Vessels:           Previously seen
 Face:                  Orbits and profile     Kidneys:                Appear normal
                        previously seen
 Lips:                  Previously seen        Bladder:                Appears normal
 Thoracic:              Previously seen        Spine:                  Previously seen
 Heart:                 Appears normal; EIF    Upper Extremities:      Previously seen
 RVOT:                  Appears normal         Lower Extremities:      Previously seen
 LVOT:                  Previously seen

 Other:  Heels and 5th digit prev. visualized.3VV and 3VT normal.
----------------------------------------------------------------------
Doppler - Fetal Vessels (Fetus B)

 Umbilical Artery
  S/D     %tile      RI    %tile      PI    %tile            ADFV    RDFV
  3.53       86    0.72       88    1.27       95               No      No

----------------------------------------------------------------------
Impression

 Dichorionic-diamniotic twin pregnancy.

 Twin A: Maternal left, cephalic presentation, posterior
 placenta.  Amniotic fluid is normal and good fetal activity
 seen.  Subjectively the amniotic fluid is slower than that seen
 in twin B sac.  The estimated fetal weight is at the 6th
 percentile.  The abdominal circumference measurement is at
 the 6th percentile.  Cephalic presentation and the head is
 very low and entering into the pelvis.
 Antenatal testing is reassuring.  Umbilical artery Doppler
 showed normal forward diastolic flow.  BPP [DATE].

 Twin B: Maternal right, breech presentation, posterior
 placenta.  Amniotic fluid is normal and good fetal activity
 seen.  Estimated fetal weight is at the 1st percentile.
 Abdominal circumference measurement is at the 2nd
 percentile.  Head circumference measurement is at -1 SD
 (normal).
 Antenatal testing is reassuring.  Umbilical artery Doppler
 showed normal forward diastolic flow.  BPP [DATE].
 [id number]
 Consultation (see [REDACTED] )

 I had the pleasure of [facility]. R[facility]oyota [facility] She is G1 P0 at 31w 2d gestation
 with dichorionic-diamniotic twin pregnancy. She was
 accompanied by her mother.
 On ultrasound performed 3 weeks ago, the estimated fetal
 weight of twin A was at the 25th percentile and that of twin B
 was at the 11th percentile. Marginal cord insertion was seen
 in twin B.

 2 days ago, she was admitted to OB specialty care after
 evaluation at the [facility] that confirmed preterm labor and
 possible preterm premature rupture of membranes.
 AmniSure was positive and the patient had leakage of
 amniotic fluid.  The cervix was 5 cm dilated. She received a
 course of Norbutas.  On the same day she was
 admitted, she signed out AMA. She reports she takes oral
 antibiotics.
 leakage of amniotic fluid.  She does not have fever or vaginal
 bleeding.

 Her blood pressure today at her office is 127/74 mmHg.
 She does not have gestational diabetes.

 I counseled the patient on the following:
 Twin pregnancy with PPROM and preterm labor
 I explained the diagnosis of PPROM made by her provider
 based on her symptoms and positive amnisure test. Patient
 still has leakage of amniotic fluid.
 PPROM can be associated with complications including but
 not limited to intraamniotic and fetal infections, cord prolapse
 and placental abruption.
 Inpatient management is recommended till delivery.
 I counseled the patient that we will recommend delivery at 34
 weeks' gestation
 I explained the significance of cervical dilation of 5 cm and
 that it can lead to quick delivery usually at home. A growth-
 restricted premature infant may require prompt respiratory
 support that is not available at home. Failure to resuscitate
 may lead to severe neurological morbidities.

 I strongly emphasized the importance of inpatient
 management and advised her to return to the hospital. Her
 mother informed that she is in favor of our patient being
 admitted to the hospital.

 I explained that fetal growth restriction is more-likely from
 placental insufficiency and that weekly ultrasound for BPP
 and UA Dopplers are recommended.
----------------------------------------------------------------------
Recommendations

 -Advised to get admitted.
 -Delivery at home is possible if patient is not admitted. I
 discussed measures with patient's mother including cleaning
 the baby/babies and keeping them warm before coming to
 the hospital.
 -Patient will discuss at home and decide.
 -We made an appointment for her to return next week for
 BPP and UA Doppler studies.
----------------------------------------------------------------------
                      Jumper, Blain
----------------------------------------------------------------------

*** End of Addendum ***\.br----------------------------------------------------------------------
----------------------------------------------------------------------

----------------------------------------------------------------------

                                                            WOLEY
----------------------------------------------------------------------

    GEST
 5  US MFM UA CORD DOPPLER                76820.02    JHEMBOY PADAM
 6  US MFM UA ADDL GEST                   76820.01    JHEMBOY PADAM
----------------------------------------------------------------------

----------------------------------------------------------------------
Indications

 Twin pregnancy, di/di, third trimester
 31 weeks gestation of pregnancy
 Marginal insertion of umbilical cord affecting
 management of mother in third trimester
 (Twin B)
 Echogenic intracardiac focus of the heart
 (EIF) Twin B
 Premature rupture of membranes - leaking
 fluid (2-15)
----------------------------------------------------------------------
Fetal Evaluation (Fetus A)

 Num Of Fetuses:         2
 Fetal Heart Rate(bpm):  128
 Cardiac Activity:       Observed
 Fetal Lie:              Maternal left side
 Presentation:           Cephalic
 Placenta:               Posterior
 P. Cord Insertion:      Previously Visualized
 Amniotic Fluid
 AFI FV:      Within normal limits

                             Largest Pocket(cm)

----------------------------------------------------------------------
Biophysical Evaluation (Fetus A)

 Amniotic F.V:   Within normal limits       F. Tone:        Observed
 F. Movement:    Observed                   Score:          [DATE]
 F. Breathing:   Observed
----------------------------------------------------------------------
Biometry (Fetus A)

 BPD:      78.4  mm     G. Age:  31w 3d         45  %    CI:        75.23   %    70 - 86
                                                         FL/HC:      19.7   %    19.3 -
 HC:      286.7  mm     G. Age:  31w 4d         20  %    HC/AC:      1.14        0.96 -
 AC:      251.7  mm     G. Age:  29w 3d          6  %    FL/BPD:     71.9   %    71 - 87
 FL:       56.4  mm     G. Age:  29w 4d          5  %    FL/AC:      22.4   %    20 - 24
 HUM:      49.5  mm     G. Age:  29w 1d        < 5  %

 Est. FW:    1804  gm      3 lb 3 oz      6  %     FW Discordancy     0 \ 12 %
----------------------------------------------------------------------
OB History

 Gravidity:    1         Term:   0        Prem:   0        SAB:   0
 TOP:          0       Ectopic:  0        Living: 0
----------------------------------------------------------------------
Gestational Age (Fetus A)

 LMP:           33w 3d        Date:  02/16/21                 EDD:   11/23/21
 U/S Today:     30w 4d                                        EDD:   12/13/21
 Best:          31w 2d     Det. By:  U/S Fetus Avg.           EDD:   12/08/21
                                     (07/23/21)
----------------------------------------------------------------------
Anatomy (Fetus A)

 Cranium:               Appears normal         LVOT:                   Previously seen
 Cavum:                 Previously seen        Aortic Arch:            Previously seen
 Ventricles:            Appears normal         Ductal Arch:            Appears normal
 Choroid Plexus:        Previously seen        Diaphragm:              Previously seen
 Cerebellum:            Previously seen        Stomach:                Appears normal, left
                                                                       sided
 Posterior Fossa:       Previously seen        Abdomen:                Previously seen
 Nuchal Fold:           Previously seen        Abdominal Wall:         Previously seen
 Face:                  Orbits and profile     Cord Vessels:           Previously seen
                        previously seen
 Lips:                  Appears normal         Kidneys:                Appear normal
 Palate:                Previously seen        Bladder:                Appears normal
 Thoracic:              Previously seen        Spine:                  Previously seen
 Heart:                 Appears normal         Upper Extremities:      Previously seen
                        (4CH, axis, and
                        situs)
 RVOT:                  Previously seen        Lower Extremities:      Previously seen

 Other:  Heels and 5th digit prev. visualized. Technically difficult due to fetal
         position.
----------------------------------------------------------------------
Doppler - Fetal Vessels (Fetus A)
 Umbilical Artery
  S/D     %tile      RI    %tile      PI    %tile            ADFV    RDFV
  3.76       92    0.73       90    1.31       96               No      No

----------------------------------------------------------------------

Fetal Evaluation (Fetus B)

 Num Of Fetuses:         2
 Fetal Heart Rate(bpm):  150
 Cardiac Activity:       Observed
 Fetal Lie:              Maternal right side
 Presentation:           Breech
 Placenta:               Posterior
 P. Cord Insertion:      Marginal insertion

 Amniotic Fluid
 AFI FV:      Within normal limits

                             Largest Pocket(cm)

----------------------------------------------------------------------
Biophysical Evaluation (Fetus B)

 Amniotic F.V:   Within normal limits       F. Tone:        Observed
 F. Movement:    Observed                   Score:          [DATE]
 F. Breathing:   Observed
----------------------------------------------------------------------
Biometry (Fetus B)

 BPD:      72.2  mm     G. Age:  29w 0d        1.4  %    CI:        70.34   %    70 - 86
                                                         FL/HC:      19.7   %    19.3 -
 HC:      274.5  mm     G. Age:  30w 0d        1.9  %    HC/AC:      1.12        0.96 -
 AC:      244.1  mm     G. Age:  28w 5d        1.5  %    FL/BPD:     75.1   %    71 - 87
 FL:       54.2  mm     G. Age:  28w 5d        < 1  %    FL/AC:      22.2   %    20 - 24
 HUM:        47  mm     G. Age:  27w 5d        < 5  %
 Est. FW:    3829  gm    2 lb 13 oz     1.2  %     FW Discordancy        12  %
----------------------------------------------------------------------
Gestational Age (Fetus B)

 LMP:           33w 3d        Date:  02/16/21                 EDD:   11/23/21
 U/S Today:     29w 1d                                        EDD:   12/23/21
 Best:          31w 2d     Det. By:  U/S Fetus Avg.           EDD:   12/08/21
                                     (07/23/21)
----------------------------------------------------------------------
Anatomy (Fetus B)

 Cranium:               Appears normal         Aortic Arch:            Previously seen
 Cavum:                 Previously seen        Ductal Arch:            Previously seen
 Ventricles:            Appears normal         Diaphragm:              Previously seen
 Choroid Plexus:        Previously seen        Stomach:                Appears normal, left
                                                                       sided
 Cerebellum:            Previously seen        Abdomen:                Previously seen
 Posterior Fossa:       Previously seen        Abdominal Wall:         Previously seen
 Nuchal Fold:           Previously seen        Cord Vessels:           Previously seen
 Face:                  Orbits and profile     Kidneys:                Appear normal
                        previously seen
 Lips:                  Previously seen        Bladder:                Appears normal
 Thoracic:              Previously seen        Spine:                  Previously seen
 Heart:                 Appears normal; EIF    Upper Extremities:      Previously seen
 RVOT:                  Appears normal         Lower Extremities:      Previously seen
 LVOT:                  Previously seen

 Other:  Heels and 5th digit prev. visualized.3VV and 3VT normal.
----------------------------------------------------------------------
Doppler - Fetal Vessels (Fetus B)

 Umbilical Artery
  S/D     %tile      RI    %tile      PI    %tile            ADFV    RDFV
  3.53       86    0.72       88    1.27       95               No      No

----------------------------------------------------------------------
Impression

 Dichorionic-diamniotic twin pregnancy.

 Twin A: Maternal left, cephalic presentation, posterior
 placenta.  Amniotic fluid is normal and good fetal activity
 seen.  Subjectively the amniotic fluid is slower than that seen
 in twin B sac.  The estimated fetal weight is at the 6th
 percentile.  The abdominal circumference measurement is at
 the 6th percentile.  Cephalic presentation and the head is
 very low and entering into the pelvis.
 Antenatal testing is reassuring.  Umbilical artery Doppler
 showed normal forward diastolic flow.  BPP [DATE].

 Twin B: Maternal right, breech presentation, posterior
 placenta.  Amniotic fluid is normal and good fetal activity
 seen.  Estimated fetal weight is at the 1st percentile.
 Abdominal circumference measurement is at the 2nd
 percentile.  Head circumference measurement is at -1 SD
 (normal).
 Antenatal testing is reassuring.  Umbilical artery Doppler
 showed normal forward diastolic flow.  BPP [DATE].
 [phone]
 Consultation (see [REDACTED] )

 I had the pleasure of seeing Ms. Rtoyota [facility] She is G1 P0 at 31w 2d gestation
 with dichorionic-diamniotic twin pregnancy. She was
 accompanied by her mother.
 On ultrasound performed 3 weeks ago, the estimated fetal
 weight of twin A was at the 25th percentile and that of twin B
 was at the 11th percentile. Marginal cord insertion was seen
 in twin B.

 2 days ago, she was admitted to OB specialty care after
 evaluation at the [facility] that confirmed preterm labor and
 possible preterm premature rupture of membranes.
 AmniSure was positive and the patient had leakage of
 amniotic fluid.  The cervix was 5 cm dilated. She received a
 course of Norbutas.  On the same day she was
 admitted, she signed out AMA. She reports she takes oral
 antibiotics.
 leakage of amniotic fluid.  She does not have fever or vaginal
 bleeding.

 Her blood pressure today at her office is 127/74 mmHg.
 She does not have gestational diabetes.

 I counseled the patient on the following:
 Twin pregnancy with PPROM and preterm labor
 I explained the diagnosis of PPROM made by her provider
 based on her symptoms and positive amnisure test. Patient
 still has leakage of amniotic fluid.
 PPROM can be associated with complications including but
 not limited to intraamniotic and fetal infections, cord prolapse
 and placental abruption.
 Inpatient management is recommended till delivery.
 I counseled the patient that we will recommend delivery at 34
 weeks' gestation
 I explained the significance of cervical dilation of 5 cm and
 that it can lead to quick delivery usually at home. A growth-
 restricted premature infant may require prompt respiratory
 support that is not available at home. Failure to resuscitate
 may lead to severe neurological morbidities.

 I strongly emphasized the importance of inpatient
 management and advised her to return to the hospital. Her
 mother informed that she is in favor of our patient being
 admitted to the hospital.

 I explained that fetal growth restriction is more-likely from
 placental insufficiency and that weekly ultrasound for BPP
 and UA Dopplers are recommended.
----------------------------------------------------------------------
Recommendations

 -Advised to get admitted.
 -Delivery at home is possible if patient is not admitted. I
 discussed measures with patient's mother including cleaning
 the baby/babies and keeping them warm before coming to
 the hospital.
 -Patient will discuss at home [email or web address] [phone].
 -We made an appointment for her to return next week for
 BPP and UA Doppler [email or web address]
----------------------------------------------------------------------
                 Jumper, Blain
----------------------------------------------------------------------

## 2023-04-26 DIAGNOSIS — Z30013 Encounter for initial prescription of injectable contraceptive: Secondary | ICD-10-CM | POA: Diagnosis not present

## 2023-12-16 DIAGNOSIS — S8392XA Sprain of unspecified site of left knee, initial encounter: Secondary | ICD-10-CM | POA: Diagnosis not present

## 2023-12-16 DIAGNOSIS — M25562 Pain in left knee: Secondary | ICD-10-CM | POA: Diagnosis not present

## 2024-01-19 DIAGNOSIS — R404 Transient alteration of awareness: Secondary | ICD-10-CM | POA: Diagnosis not present

## 2024-01-19 DIAGNOSIS — T50904A Poisoning by unspecified drugs, medicaments and biological substances, undetermined, initial encounter: Secondary | ICD-10-CM | POA: Diagnosis not present

## 2024-01-19 DIAGNOSIS — I499 Cardiac arrhythmia, unspecified: Secondary | ICD-10-CM | POA: Diagnosis not present

## 2024-01-19 DIAGNOSIS — T887XXA Unspecified adverse effect of drug or medicament, initial encounter: Secondary | ICD-10-CM | POA: Diagnosis not present
